# Patient Record
Sex: Female | Born: 1995 | Race: White | Hispanic: No | Marital: Single | Smoking: Former smoker
Health system: Southern US, Community
[De-identification: ages and names within clinical notes are randomized; demographics above are authoritative.]

## PROBLEM LIST (undated history)

## (undated) ENCOUNTER — Inpatient Hospital Stay (HOSPITAL_COMMUNITY): Payer: Self-pay

## (undated) DIAGNOSIS — R87629 Unspecified abnormal cytological findings in specimens from vagina: Secondary | ICD-10-CM

## (undated) DIAGNOSIS — B029 Zoster without complications: Secondary | ICD-10-CM

## (undated) HISTORY — PX: NO PAST SURGERIES: SHX2092

## (undated) HISTORY — DX: Zoster without complications: B02.9

## (undated) HISTORY — DX: Unspecified abnormal cytological findings in specimens from vagina: R87.629

---
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
## 2016-03-23 ENCOUNTER — Ambulatory Visit (INDEPENDENT_AMBULATORY_CARE_PROVIDER_SITE_OTHER): Payer: Self-pay | Admitting: Family Medicine

## 2016-03-23 ENCOUNTER — Encounter: Payer: Self-pay | Admitting: Family Medicine

## 2016-03-23 VITALS — BP 108/69 | HR 89 | Temp 98.4°F | Ht 66.06 in | Wt 133.0 lb

## 2016-03-23 DIAGNOSIS — N926 Irregular menstruation, unspecified: Secondary | ICD-10-CM

## 2016-03-23 DIAGNOSIS — Z3491 Encounter for supervision of normal pregnancy, unspecified, first trimester: Secondary | ICD-10-CM

## 2016-03-23 DIAGNOSIS — N912 Amenorrhea, unspecified: Secondary | ICD-10-CM

## 2016-03-23 LAB — PREGNANCY, URINE: Preg Test, Ur: POSITIVE — AB

## 2016-03-23 NOTE — Patient Instructions (Signed)
Great to meet you guys!  Stop smoking (think about the baby)  Start a daily pre-natal vitamin  Go ahead and start looking into a GYN practice   Estimated delivery date December 15/2017

## 2016-03-23 NOTE — Progress Notes (Signed)
   HPI  Patient presents today here after a missed period  She explains that she is due for her perido today but hasnt had any PMS symptoms that are typical for her.   She has had 2 positive Preg tests today  It is unplanned but welcome, Her boyfriend is present LMP 02/24/2016  They have been having unprotected sex  She is not taking vitamins, she is a smoker.   PMH: Smoking status noted ROS: Per HPI  Objective: BP 108/69 mmHg  Pulse 89  Temp(Src) 98.4 F (36.9 C) (Oral)  Ht 5' 6.06" (1.678 m)  Wt 133 lb (60.328 kg)  BMI 21.43 kg/m2  LMP 02/24/2016 Gen: NAD, alert, cooperative with exam HEENT: NCAT, CV: RRR, good S1/S2, no murmur Resp: CTABL, no wheezes, non-labored Ext: No edema, warm Neuro: Alert and oriented, No gross deficits  Assessment and plan:  #  Missed period, First trimester pregnancy Disccussed pre-natal vitamins and stopping smoking  Discussed prenatal care and encouraged to pursue medicaid right away Call or come back with any questions.   EDD 11/30/2016   Murtis SinkSam Bradshaw, MD [facility] 03/23/2016, 9:13 AM

## 2016-04-26 ENCOUNTER — Inpatient Hospital Stay (HOSPITAL_COMMUNITY)
Admission: AD | Admit: 2016-04-26 | Discharge: 2016-04-26 | Disposition: A | Discharge status: Home / Self Care | Payer: Medicaid Other | Source: Ambulatory Visit | Attending: Obstetrics and Gynecology | Admitting: Obstetrics and Gynecology

## 2016-04-26 ENCOUNTER — Inpatient Hospital Stay (HOSPITAL_COMMUNITY): Payer: Medicaid Other

## 2016-04-26 ENCOUNTER — Encounter (HOSPITAL_COMMUNITY): Payer: Self-pay | Admitting: Student

## 2016-04-26 DIAGNOSIS — O4691 Antepartum hemorrhage, unspecified, first trimester: Secondary | ICD-10-CM | POA: Diagnosis not present

## 2016-04-26 DIAGNOSIS — Z3A08 8 weeks gestation of pregnancy: Secondary | ICD-10-CM | POA: Diagnosis not present

## 2016-04-26 DIAGNOSIS — O209 Hemorrhage in early pregnancy, unspecified: Secondary | ICD-10-CM | POA: Diagnosis not present

## 2016-04-26 DIAGNOSIS — O360110 Maternal care for anti-D [Rh] antibodies, first trimester, not applicable or unspecified: Secondary | ICD-10-CM

## 2016-04-26 DIAGNOSIS — J45909 Unspecified asthma, uncomplicated: Secondary | ICD-10-CM | POA: Diagnosis not present

## 2016-04-26 DIAGNOSIS — O26892 Other specified pregnancy related conditions, second trimester: Secondary | ICD-10-CM

## 2016-04-26 DIAGNOSIS — N898 Other specified noninflammatory disorders of vagina: Secondary | ICD-10-CM

## 2016-04-26 DIAGNOSIS — O3680X0 Pregnancy with inconclusive fetal viability, not applicable or unspecified: Secondary | ICD-10-CM

## 2016-04-26 DIAGNOSIS — N939 Abnormal uterine and vaginal bleeding, unspecified: Secondary | ICD-10-CM | POA: Diagnosis present

## 2016-04-26 LAB — URINALYSIS, ROUTINE W REFLEX MICROSCOPIC
BILIRUBIN URINE: NEGATIVE
GLUCOSE, UA: NEGATIVE mg/dL
KETONES UR: NEGATIVE mg/dL
Leukocytes, UA: NEGATIVE
Nitrite: NEGATIVE
PH: 8.5 — AB (ref 5.0–8.0)
Protein, ur: NEGATIVE mg/dL
SPECIFIC GRAVITY, URINE: 1.015 (ref 1.005–1.030)

## 2016-04-26 LAB — CBC
HEMATOCRIT: 38.4 % (ref 36.0–46.0)
HEMOGLOBIN: 13.2 g/dL (ref 12.0–15.0)
MCH: 30.2 pg (ref 26.0–34.0)
MCHC: 34.4 g/dL (ref 30.0–36.0)
MCV: 87.9 fL (ref 78.0–100.0)
Platelets: 238 10*3/uL (ref 150–400)
RBC: 4.37 MIL/uL (ref 3.87–5.11)
RDW: 12.8 % (ref 11.5–15.5)
WBC: 9.6 10*3/uL (ref 4.0–10.5)

## 2016-04-26 LAB — WET PREP, GENITAL
Sperm: NONE SEEN
TRICH WET PREP: NONE SEEN
YEAST WET PREP: NONE SEEN

## 2016-04-26 LAB — URINE MICROSCOPIC-ADD ON: WBC UA: NONE SEEN WBC/hpf (ref 0–5)

## 2016-04-26 LAB — ABO/RH: ABO/RH(D): O NEG

## 2016-04-26 LAB — HCG, QUANTITATIVE, PREGNANCY: HCG, BETA CHAIN, QUANT, S: 26192 m[IU]/mL — AB (ref <5)

## 2016-04-26 MED ORDER — RHO D IMMUNE GLOBULIN 1500 UNIT/2ML IJ SOSY
300.0000 ug | PREFILLED_SYRINGE | Freq: Once | INTRAMUSCULAR | Status: AC
  Administered 2016-04-26: 300 ug via INTRAMUSCULAR
  Filled 2016-04-26: qty 2

## 2016-04-26 MED ORDER — ACETAMINOPHEN 500 MG PO TABS
1000.0000 mg | ORAL_TABLET | Freq: Once | ORAL | Status: AC
  Administered 2016-04-26: 1000 mg via ORAL
  Filled 2016-04-26: qty 2

## 2016-04-26 NOTE — MAU Note (Signed)
Pt reports vaginal bleeding since friday. Has gotten havier today and bright red. C/o period like cramps.

## 2016-04-26 NOTE — Discharge Instructions (Signed)
Return to care  °· If you have heavier bleeding that soaks through more that 2 pads per hour for an hour or more °· If you bleed so much that you feel like you might pass out or you do pass out °· If you have significant abdominal pain that is not improved with Tylenol  °· If you develop a fever > 100.5 ° ° ° ° °Vaginal Bleeding During Pregnancy, First Trimester °A small amount of bleeding (spotting) from the vagina is relatively common in early pregnancy. It usually stops on its own. Various things may cause bleeding or spotting in early pregnancy. Some bleeding may be related to the pregnancy, and some may not. In most cases, the bleeding is normal and is not a problem. However, bleeding can also be a sign of something serious. Be sure to tell your health care provider about any vaginal bleeding right away. °Some possible causes of vaginal bleeding during the first trimester include: °· Infection or inflammation of the cervix. °· Growths (polyps) on the cervix. °· Miscarriage or threatened miscarriage. °· Pregnancy tissue has developed outside of the uterus and in a fallopian tube (tubal pregnancy). °· Tiny cysts have developed in the uterus instead of pregnancy tissue (molar pregnancy). °HOME CARE INSTRUCTIONS  °Watch your condition for any changes. The following actions may help to lessen any discomfort you are feeling: °· Follow your health care provider's instructions for limiting your activity. If your health care provider orders bed rest, you may need to stay in bed and only get up to use the bathroom. However, your health care provider may allow you to continue light activity. °· If needed, make plans for someone to help with your regular activities and responsibilities while you are on bed rest. °· Keep track of the number of pads you use each day, how often you change pads, and how soaked (saturated) they are. Write this down. °· Do not use tampons. Do not douche. °· Do not have sexual intercourse or  orgasms until approved by your health care provider. °· If you pass any tissue from your vagina, save the tissue so you can show it to your health care provider. °· Only take over-the-counter or prescription medicines as directed by your health care provider. °· Do not take aspirin because it can make you bleed. °· Keep all follow-up appointments as directed by your health care provider. °SEEK MEDICAL CARE IF: °· You have any vaginal bleeding during any part of your pregnancy. °· You have cramps or labor pains. °· You have a fever, not controlled by medicine. °SEEK IMMEDIATE MEDICAL CARE IF:  °· You have severe cramps in your back or belly (abdomen). °· You pass large clots or tissue from your vagina. °· Your bleeding increases. °· You feel light-headed or weak, or you have fainting episodes. °· You have chills. °· You are leaking fluid or have a gush of fluid from your vagina. °· You pass out while having a bowel movement. °MAKE SURE YOU: °5. Understand these instructions. °6. Will watch your condition. °7. Will get help right away if you are not doing well or get worse. °  °This information is not intended to replace advice given to you by your health care provider. Make sure you discuss any questions you have with your health care provider. °  °Document Released: 09/12/2005 Document Revised: 12/08/2013 Document Reviewed: 08/10/2013 °Elsevier Interactive Patient Education ©2016 Elsevier Inc. ° °

## 2016-04-26 NOTE — MAU Provider Note (Signed)
History     CSN: [id number]  Arrival date and time: 04/26/16 1258    First Provider Initiated Contact with Patient 04/26/16 1335        Chief Complaint  Patient presents with  . Vaginal Bleeding   HPI  Tonya Parks is a 20 y.o. G1P0 at [redacted]w[redacted]d by LMP who presents with abdominal cramping & vaginal bleeding. Patient is going to an ob/gyn in [place] but has not had an ultrasound during this pregnancy. Went to ob this morning & was told bleeding was normal but did not have exam or ultrasound done today.  Symptoms began Friday. Started has red spotting but has increased over the last few days. Reports moderate amount of dark red blood that is filling pads and several small clots. Lower abdominal cramping is constant. Rates 10/10. Took 1 ES tylenol yesterday with no relief.  Denies n/v/d, constipation, dysuria, fever.  Last had intercourse last night.   OB History    Gravida Para Term Preterm AB TAB SAB Ectopic Multiple Living   1               Past Medical History  Diagnosis Date  . Asthma   . Migraine     Past Surgical History  Procedure Laterality Date  . No past surgeries      Family History  Problem Relation Age of Onset  . Stroke Mother   . COPD Mother   . Asthma Mother     Social History  Substance Use Topics  . Smoking status: Never Smoker   . Smokeless tobacco: Not on file  . Alcohol Use: No    Allergies: No Known Allergies  No prescriptions prior to admission    Review of Systems  Constitutional: Negative.   Gastrointestinal: Positive for abdominal pain. Negative for nausea, vomiting, diarrhea and constipation.  Genitourinary: Negative for dysuria.       + vaginal bleeding   Physical Exam   Blood pressure 121/70, pulse 98, temperature 97.8 F (36.6 C), temperature source Oral, resp. rate 18, last menstrual period 02/24/2016.  Physical Exam  Nursing note and vitals reviewed. Constitutional: She is oriented to person, place, and time. She appears  well-developed and well-nourished. No distress.  HENT:  Head: Normocephalic and atraumatic.  Eyes: Conjunctivae are normal. Right eye exhibits no discharge. Left eye exhibits no discharge. No scleral icterus.  Neck: Normal range of motion.  Cardiovascular: Normal rate, regular rhythm and normal heart sounds.   No murmur heard. Respiratory: Effort normal and breath sounds normal. No respiratory distress. She has no wheezes.  GI: Soft. Bowel sounds are normal. There is tenderness in the right lower quadrant, suprapubic area and left lower quadrant. There is no rebound, no guarding and no tenderness at McBurney's point.  Genitourinary: Uterus is enlarged. Uterus is not tender. Cervix exhibits no motion tenderness. There is bleeding (moderate amount of dark red blood) in the vagina.  Cervix closed  Neurological: She is alert and oriented to person, place, and time.  Skin: Skin is warm and dry. She is not diaphoretic.  Psychiatric: She has a normal mood and affect. Her behavior is normal. Judgment and thought content normal.    MAU Course  Procedures Results for orders placed or performed during the hospital encounter of 04/26/16 (from the past 24 hour(s))  CBC     Status: None   Collection Time: 04/26/16  1:19 PM  Result Value Ref Range   WBC 9.6 4.0 - 10.5 K/uL   RBC  4.37 3.87 - 5.11 MIL/uL   Hemoglobin 13.2 12.0 - 15.0 g/dL   HCT 38.4 09.6 - 04.5 %   MCV 87.9 16.18.0 - 100.0 fL   MCH 30.2 26.0 - 34.0 pg   MCHC 34.4 30.0 - 36.0 g/dL   RDW 40.9 81.1 - 91.4 %   Platelets 238 150 - 400 K/uL  ABO/Rh     Status: None (Preliminary result)   Collection Time: 04/26/16  1:19 PM  Result Value Ref Range   ABO/RH(D) O NEG   hCG, quantitative, pregnancy     Status: Abnormal   Collection Time: 04/26/16  1:19 PM  Result Value Ref Range   hCG, Beta Chain, Quant, S 78295 (H) <5 mIU/mL  Wet prep, genital     Status: Abnormal   Collection Time: 04/26/16  1:40 PM  Result Value Ref Range   Yeast Wet  Prep HPF POC NONE SEEN NONE SEEN   Trich, Wet Prep NONE SEEN NONE SEEN   Clue Cells Wet Prep HPF POC FEW (A) NONE SEEN   WBC, Wet Prep HPF POC FEW (A) NONE SEEN   Sperm NONE SEEN   Urinalysis, Routine w reflex microscopic (not at [facility])     Status: Abnormal   Collection Time: 04/26/16  2:32 PM  Result Value Ref Range   Color, Urine YELLOW YELLOW   APPearance HAZY (A) CLEAR   Specific Gravity, Urine 1.015 1.005 - 1.030   pH 8.5 (H) 5.0 - 8.0   Glucose, UA NEGATIVE NEGATIVE mg/dL   Hgb urine dipstick LARGE (A) NEGATIVE   Bilirubin Urine NEGATIVE NEGATIVE   Ketones, ur NEGATIVE NEGATIVE mg/dL   Protein, ur NEGATIVE NEGATIVE mg/dL   Nitrite NEGATIVE NEGATIVE   Leukocytes, UA NEGATIVE NEGATIVE  Urine microscopic-add on     Status: Abnormal   Collection Time: 04/26/16  2:32 PM  Result Value Ref Range   Squamous Epithelial / LPF 0-5 (A) NONE SEEN   WBC, UA NONE SEEN 0 - 5 WBC/hpf   RBC / HPF TOO NUMEROUS TO COUNT 0 - 5 RBC/hpf   Bacteria, UA MANY (A) NONE SEEN   Urine-Other AMORPHOUS URATES/PHOSPHATES    US Ob Comp Less 14 Wks  04/26/2016  CLINICAL DATA:  Pregnant patient with vaginal bleeding. EXAM: OBSTETRIC <14 WK [place] AND TRANSVAGINAL OB US TECHNIQUE: Both transabdominal and transvaginal ultrasound examinations were performed for complete evaluation of the gestation as well as the maternal uterus, adnexal regions, and pelvic cul-de-sac. Transvaginal technique was performed to assess early pregnancy. COMPARISON:  None. FINDINGS: Intrauterine gestational sac: None Yolk sac:  Not present Embryo:  Not present Cardiac Activity: Not present Maternal uterus/adnexae: The endometrium measures 14 mm. Normal right and left ovaries. Trace free fluid in the pelvis. No adnexal masses are identified. IMPRESSION: No intrauterine gestation identified. In the setting of positive pregnancy test and no definite intrauterine pregnancy, this reflects a pregnancy of unknown location. Differential considerations  include early normal IUP, abnormal IUP, or nonvisualized ectopic pregnancy. Differentiation is achieved with serial beta HCG supplemented by repeat sonography as clinically warranted. Electronically Signed   By: Annia Belt M.D.   On: 04/26/2016 14:23   US Ob Transvaginal  04/26/2016  CLINICAL DATA:  Pregnant patient with vaginal bleeding. EXAM: OBSTETRIC <14 WK [place] AND TRANSVAGINAL OB US TECHNIQUE: Both transabdominal and transvaginal ultrasound examinations were performed for complete evaluation of the gestation as well as the maternal uterus, adnexal regions, and pelvic cul-de-sac. Transvaginal technique was performed to assess early pregnancy.  COMPARISON:  None. FINDINGS: Intrauterine gestational sac: None Yolk sac:  Not present Embryo:  Not present Cardiac Activity: Not present Maternal uterus/adnexae: The endometrium measures 14 mm. Normal right and left ovaries. Trace free fluid in the pelvis. No adnexal masses are identified. IMPRESSION: No intrauterine gestation identified. In the setting of positive pregnancy test and no definite intrauterine pregnancy, this reflects a pregnancy of unknown location. Differential considerations include early normal IUP, abnormal IUP, or nonvisualized ectopic pregnancy. Differentiation is achieved with serial beta HCG supplemented by repeat sonography as clinically warranted. Electronically Signed   By: Annia Beltrew  Davis M.D.   On: 04/26/2016 14:23    MDM O negative - will give rhophylac today VSS, hemoglobin 13.2 Ultrasound shows no IUP or adnexal mass with a BHCG of 26,192. Possible that patient recently passed the gestation but as we don't have any documentation that patient had an IUP with this pregnancy, ectopic cannot be ruled out -- will need her to follow up for a BHCG in 48 hours.  S/w Dr. Jolayne Pantheronstant regarding labs & ultrasound - [facility] results with patient. Offered chaplain services - she declines at this time.    Assessment and Plan  A: 1. Pregnancy of unknown anatomic location   2. Rh negative state in antepartum period, first trimester, not applicable or unspecified fetus   3. Vaginal bleeding in pregnancy, first trimester    P: Discharge home Return to MAU on Saturday for repeat BHCG Discussed return precautions Pelvic rest Comfort care pack given  Judeth Hornrin Ciena Sampley 04/26/2016, 1:32 PM

## 2016-04-27 LAB — RH IG WORKUP (INCLUDES ABO/RH)
ABO/RH(D): O NEG
Antibody Screen: NEGATIVE
Gestational Age(Wks): 8
UNIT DIVISION: 0

## 2016-04-27 LAB — GC/CHLAMYDIA PROBE AMP (~~LOC~~) NOT AT ARMC
Chlamydia: NEGATIVE
Neisseria Gonorrhea: NEGATIVE

## 2016-04-27 LAB — HIV ANTIBODY (ROUTINE TESTING W REFLEX): HIV SCREEN 4TH GENERATION: NONREACTIVE

## 2016-04-28 ENCOUNTER — Encounter (HOSPITAL_COMMUNITY): Payer: Self-pay

## 2016-04-28 ENCOUNTER — Inpatient Hospital Stay (HOSPITAL_COMMUNITY)
Admission: AD | Admit: 2016-04-28 | Discharge: 2016-04-28 | Disposition: A | Discharge status: Home / Self Care | Payer: Medicaid Other | Source: Ambulatory Visit | Attending: Obstetrics & Gynecology | Admitting: Obstetrics & Gynecology

## 2016-04-28 DIAGNOSIS — O039 Complete or unspecified spontaneous abortion without complication: Secondary | ICD-10-CM | POA: Insufficient documentation

## 2016-04-28 LAB — HCG, QUANTITATIVE, PREGNANCY: HCG, BETA CHAIN, QUANT, S: 4183 m[IU]/mL — AB (ref <5)

## 2016-04-28 NOTE — MAU Provider Note (Signed)
MAU HISTORY AND PHYSICAL  Chief Complaint:  Follow-up   Tonya Parks is a 20 y.o.  G1P0 with IUP at [redacted]w[redacted]d presenting for Follow-up  One week ago began spotting, then bleeding like a period, and on 5/11 with strong cramping. Presented here where hcg 26000, no iup seen on [place], no signs ectopic, either. Since discharge pain has stopped and bleeding is minimal. No fevers or n/v.   Past Medical History  Diagnosis Date  . Asthma   . Migraine     Past Surgical History  Procedure Laterality Date  . No past surgeries      Family History  Problem Relation Age of Onset  . Stroke Mother   . COPD Mother   . Asthma Mother     Social History  Substance Use Topics  . Smoking status: Current Every Day Smoker -- 1.00 packs/day    Types: Cigarettes  . Smokeless tobacco: None  . Alcohol Use: No    No Known Allergies  No prescriptions prior to admission    Review of Systems - Negative except for what is mentioned in HPI.  Physical Exam  Blood pressure 109/62, pulse 78, temperature 98.2 F (36.8 C), temperature source Oral, resp. rate 18, height  (1.676 m), weight 141 lb 12.8 oz (64.32 kg), last menstrual period 02/24/2016. GENERAL: Well-developed, well-nourished female in no acute distress.  LUNGS: Clear to auscultation bilaterally.  HEART: Regular rate and rhythm. ABDOMEN: Soft, nontender, nondistended.  EXTREMITIES: Nontender, no edema, 2+ distal pulses. Cervical Exam: deferred   Labs: Results for orders placed or performed during the hospital encounter of 04/28/16 (from the past 24 hour(s))  hCG, quantitative, pregnancy   Collection Time: 04/28/16 10:06 AM  Result Value Ref Range   hCG, Beta Chain, Quant, S 4183 (H) <5 mIU/mL    Imaging Studies:  US Ob Comp Less 14 Wks  04/26/2016  CLINICAL DATA:  Pregnant patient with vaginal bleeding. EXAM: OBSTETRIC <14 WK [place] AND TRANSVAGINAL OB US TECHNIQUE: Both transabdominal and transvaginal ultrasound examinations were  performed for complete evaluation of the gestation as well as the maternal uterus, adnexal regions, and pelvic cul-de-sac. Transvaginal technique was performed to assess early pregnancy. COMPARISON:  None. FINDINGS: Intrauterine gestational sac: None Yolk sac:  Not present Embryo:  Not present Cardiac Activity: Not present Maternal uterus/adnexae: The endometrium measures 14 mm. Normal right and left ovaries. Trace free fluid in the pelvis. No adnexal masses are identified. IMPRESSION: No intrauterine gestation identified. In the setting of positive pregnancy test and no definite intrauterine pregnancy, this reflects a pregnancy of unknown location. Differential considerations include early normal IUP, abnormal IUP, or nonvisualized ectopic pregnancy. Differentiation is achieved with serial beta HCG supplemented by repeat sonography as clinically warranted. Electronically Signed   By: Annia Belt M.D.   On: 04/26/2016 14:23   US Ob Transvaginal  04/26/2016  CLINICAL DATA:  Pregnant patient with vaginal bleeding. EXAM: OBSTETRIC <14 WK [place] AND TRANSVAGINAL OB US TECHNIQUE: Both transabdominal and transvaginal ultrasound examinations were performed for complete evaluation of the gestation as well as the maternal uterus, adnexal regions, and pelvic cul-de-sac. Transvaginal technique was performed to assess early pregnancy. COMPARISON:  None. FINDINGS: Intrauterine gestational sac: None Yolk sac:  Not present Embryo:  Not present Cardiac Activity: Not present Maternal uterus/adnexae: The endometrium measures 14 mm. Normal right and left ovaries. Trace free fluid in the pelvis. No adnexal masses are identified. IMPRESSION: No intrauterine gestation identified. In the setting of positive pregnancy test and  no definite intrauterine pregnancy, this reflects a pregnancy of unknown location. Differential considerations include early normal IUP, abnormal IUP, or nonvisualized ectopic pregnancy. Differentiation is achieved  with serial beta HCG supplemented by repeat sonography as clinically warranted. Electronically Signed   By: Annia Beltrew  Davis M.D.   On: 04/26/2016 14:23    Assessment: Tonya Parks is  20 y.o. G1P0 at 242w1d presents for f/u SAB. Appears to be resolving SAB. HCG significantly lower today than 2 days prior, and currently no pain and minimal bleeding.  Plan: - bleeding/infection/ectopic return precautions - weekly HCG in clinic to trend to zero - pelvic rest until HCG zero  Tonya RiedelNoah B Dravyn Parks 5/13/20171:53 PM

## 2016-04-28 NOTE — MAU Note (Signed)
Patient reports decrease in bleeding only having spotting, denies pain at this time, here for follow up BHCG

## 2016-04-28 NOTE — Discharge Instructions (Signed)
Miscarriage  A miscarriage is the sudden loss of an unborn baby (fetus) before the 20th week of pregnancy. Most miscarriages happen in the first 3 months of pregnancy. Sometimes, it happens before a woman even knows she is pregnant. A miscarriage is also called a "spontaneous miscarriage" or "early pregnancy loss." Having a miscarriage can be an emotional experience. Talk with your caregiver about any questions you may have about miscarrying, the grieving process, and your future pregnancy plans.  CAUSES    Problems with the fetal chromosomes that make it impossible for the baby to develop normally. Problems with the baby's genes or chromosomes are most often the result of errors that occur, by chance, as the embryo divides and grows. The problems are not inherited from the parents.   Infection of the cervix or uterus.    Hormone problems.    Problems with the cervix, such as having an incompetent cervix. This is when the tissue in the cervix is not strong enough to hold the pregnancy.    Problems with the uterus, such as an abnormally shaped uterus, uterine fibroids, or congenital abnormalities.    Certain medical conditions.    Smoking, drinking alcohol, or taking illegal drugs.    Trauma.   Often, the cause of a miscarriage is unknown.   SYMPTOMS    Vaginal bleeding or spotting, with or without cramps or pain.   Pain or cramping in the abdomen or lower back.   Passing fluid, tissue, or blood clots from the vagina.  DIAGNOSIS   Your caregiver will perform a physical exam. You may also have an ultrasound to confirm the miscarriage. Blood or urine tests may also be ordered.  TREATMENT    Sometimes, treatment is not necessary if you naturally pass all the fetal tissue that was in the uterus. If some of the fetus or placenta remains in the body (incomplete miscarriage), tissue left behind may become infected and must be removed. Usually, a dilation and curettage (D and C) procedure is performed.  During a D and C procedure, the cervix is widened (dilated) and any remaining fetal or placental tissue is gently removed from the uterus.   Antibiotic medicines are prescribed if there is an infection. Other medicines may be given to reduce the size of the uterus (contract) if there is a lot of bleeding.   If you have Rh negative blood and your baby was Rh positive, you will need a Rh immunoglobulin shot. This shot will protect any future baby from having Rh blood problems in future pregnancies.  HOME CARE INSTRUCTIONS    Your caregiver may order bed rest or may allow you to continue light activity. Resume activity as directed by your caregiver.   Have someone help with home and family responsibilities during this time.    Keep track of the number of sanitary pads you use each day and how soaked (saturated) they are. Write down this information.    Do not use tampons. Do not douche or have sexual intercourse until approved by your caregiver.    Only take over-the-counter or prescription medicines for pain or discomfort as directed by your caregiver.    Do not take aspirin. Aspirin can cause bleeding.    Keep all follow-up appointments with your caregiver.    If you or your partner have problems with grieving, talk to your caregiver or seek counseling to help cope with the pregnancy loss. Allow enough time to grieve before trying to get pregnant again.     SEEK IMMEDIATE MEDICAL CARE IF:    You have severe cramps or pain in your back or abdomen.   You have a fever.   You pass large blood clots (walnut-sized or larger) ortissue from your vagina. Save any tissue for your caregiver to inspect.    Your bleeding increases.    You have a thick, bad-smelling vaginal discharge.   You become lightheaded, weak, or you faint.    You have chills.   MAKE SURE YOU:   Understand these instructions.   Will watch your condition.   Will get help right away if you are not doing well or get worse.     This  information is not intended to replace advice given to you by your health care provider. Make sure you discuss any questions you have with your health care provider.     Document Released: 05/29/2001 Document Revised: 03/30/2013 Document Reviewed: 01/22/2012  Elsevier Interactive Patient Education 2016 [employer]vier Inc[employer]

## 2016-05-04 ENCOUNTER — Other Ambulatory Visit: Payer: Medicaid Other

## 2016-05-07 ENCOUNTER — Other Ambulatory Visit: Payer: Medicaid Other

## 2016-05-07 DIAGNOSIS — O3680X0 Pregnancy with inconclusive fetal viability, not applicable or unspecified: Secondary | ICD-10-CM

## 2016-05-08 LAB — HCG, QUANTITATIVE, PREGNANCY: HCG, BETA CHAIN, QUANT, S: 76 m[IU]/mL — AB

## 2016-05-09 ENCOUNTER — Telehealth: Payer: Self-pay | Admitting: *Deleted

## 2016-05-09 NOTE — Telephone Encounter (Signed)
-----   Message from Adam PhenixJames G Arnold, MD sent at 05/09/2016 10:42 AM EDT ----- Decreased HCG c/w miscarriage, f/u appt here or with [facility]

## 2016-05-09 NOTE — Telephone Encounter (Signed)
Called patient and notified of test results being consistent with miscarriage. Patient wanted to let us know that even though she was told not to have sex she did the night before her blood draw because her bleeding had stopped and she thought it would be ok. Wanted to know it this would have affected anything. I reassured her that at that point the miscarriage had probably already been completed and the results would not have been changed. Gave patient the option of f/u in this clinic or with Dr Ermalinda Memosbradshaw. She prefers to come to this clinic. Will have front office call her to schedule a f/u visit.

## 2016-05-18 ENCOUNTER — Other Ambulatory Visit: Payer: Medicaid Other

## 2016-05-18 DIAGNOSIS — O039 Complete or unspecified spontaneous abortion without complication: Secondary | ICD-10-CM

## 2016-05-18 LAB — HCG, QUANTITATIVE, PREGNANCY: HCG, BETA CHAIN, QUANT, S: 4.9 m[IU]/mL

## 2016-05-21 ENCOUNTER — Telehealth: Payer: Self-pay | Admitting: *Deleted

## 2016-05-21 NOTE — Telephone Encounter (Signed)
Per message from Dr. Ashok PallWouk need to call patient and tell  Her bhcg is essentially zero- can stop drawing bhcg- can stop pelvic rest. Miscarriage is complete.   Called Tonya Parks and left a message we are calling with some information- please call clinic.

## 2016-05-22 ENCOUNTER — Encounter: Payer: Self-pay | Admitting: *Deleted

## 2016-05-22 NOTE — Telephone Encounter (Signed)
Called Tonya FortKarlie and left a message we are trying to reach you with some non-urgent information. Also called her contact number and left a message we are trying to reach Tonya Parks- please ask her to call clinic.  Will send letter.

## 2017-03-01 ENCOUNTER — Encounter (HOSPITAL_COMMUNITY): Payer: Self-pay

## 2018-02-21 IMAGING — US US OB COMP LESS 14 WK
1 series · 15 of 28 positions shown · non-contrast
Comparison: None.

CLINICAL DATA: Pregnant patient with vaginal bleeding.

EXAM:
OBSTETRIC <14 WK US AND TRANSVAGINAL OB US
TECHNIQUE: Both transabdominal and transvaginal ultrasound examinations were
performed for complete evaluation of the gestation as well as the
maternal uterus, adnexal regions, and pelvic cul-de-sac.
Transvaginal technique was performed to assess early pregnancy.

[Series 1: us ob comp less 14 wk · 15 of 38 slices shown]
[im 1/38]
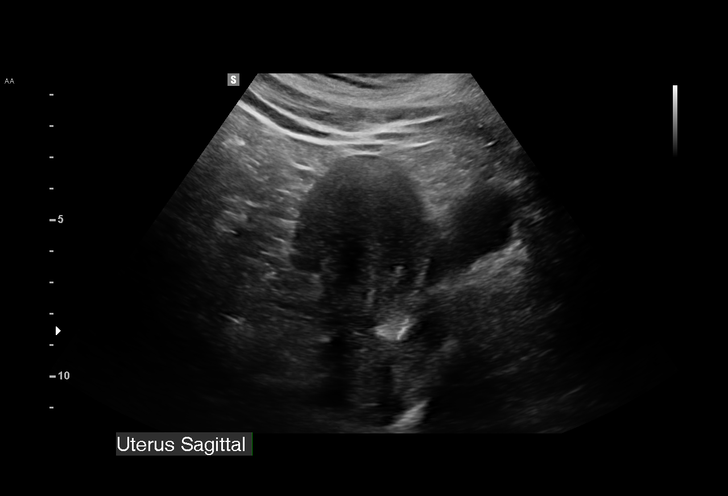
[im 3/38]
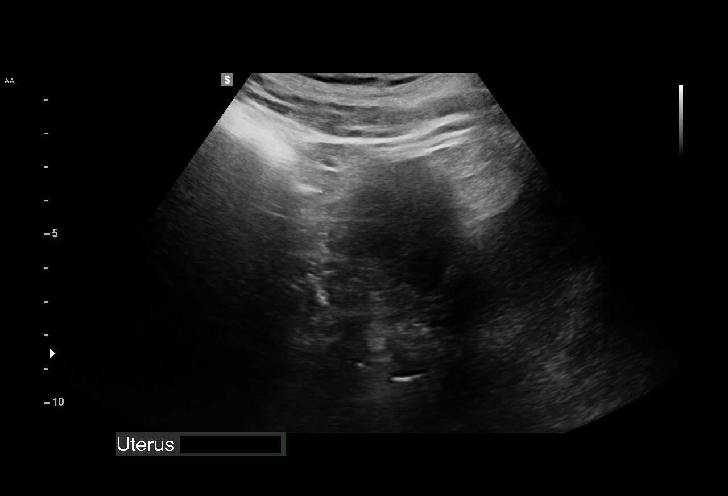
[im 6/38]
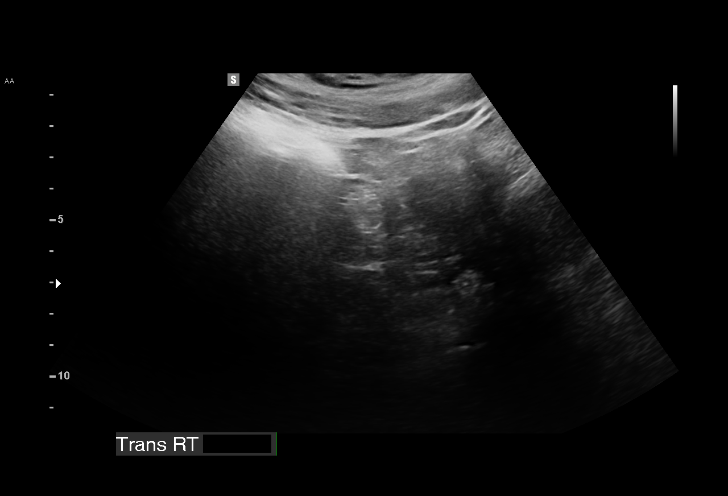
[im 9/38]
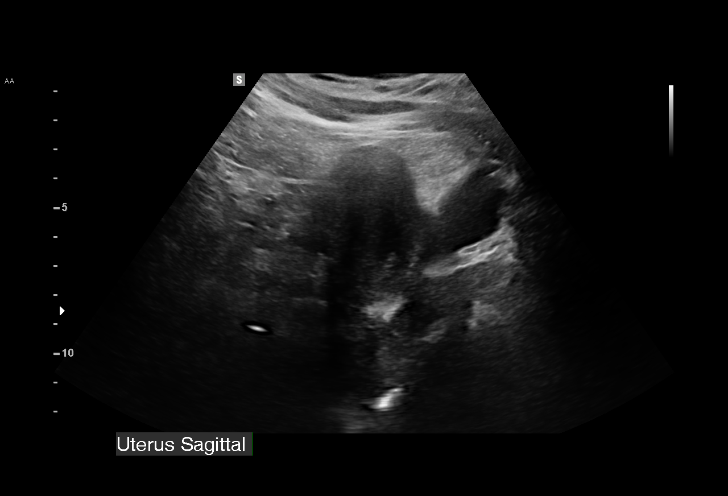
[im 11/38]
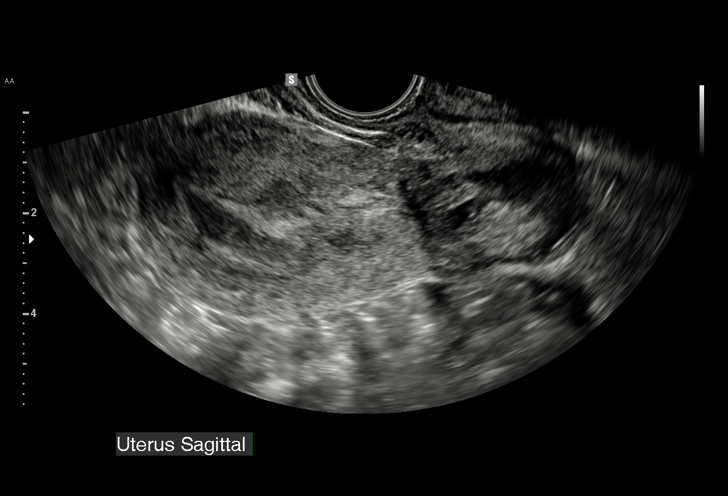
[im 14/38]
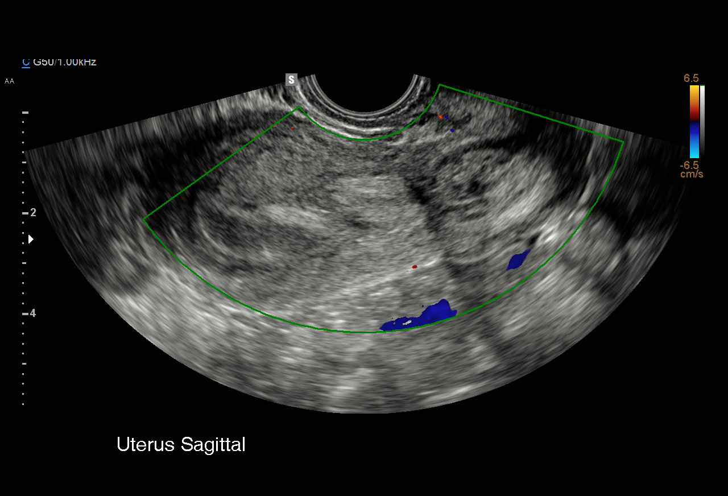
[im 17/38]
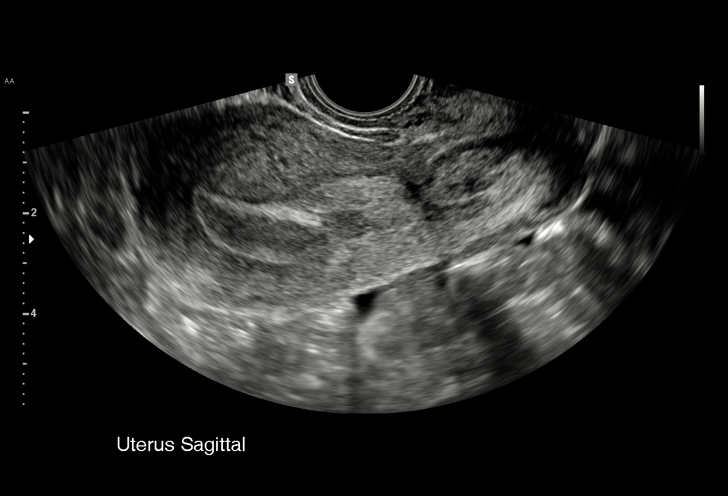
[im 20/38]
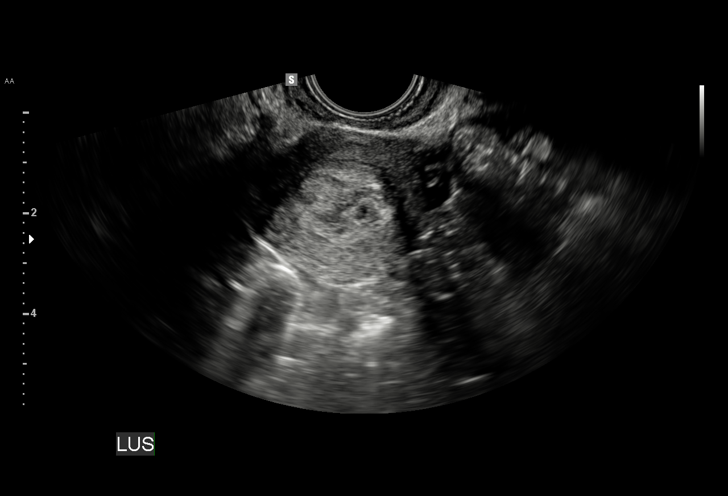
[im 21/38]
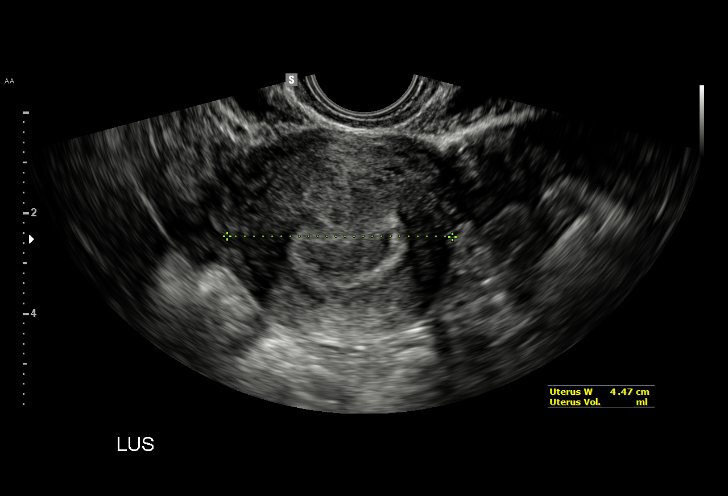
[im 24/38]
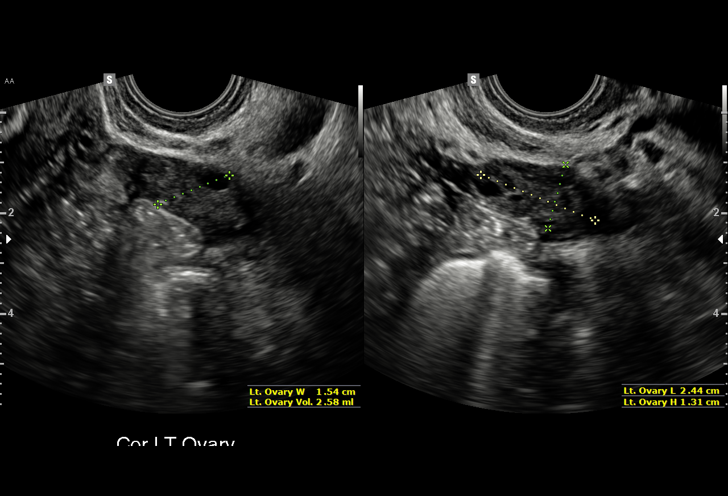
[im 27/38]
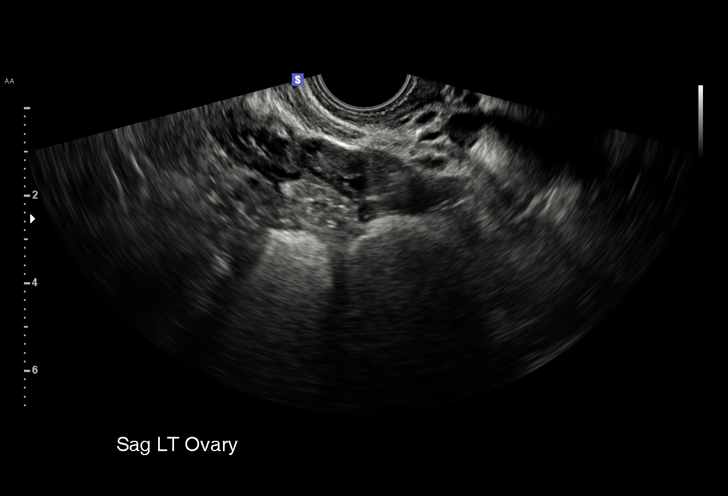
[im 29/38]
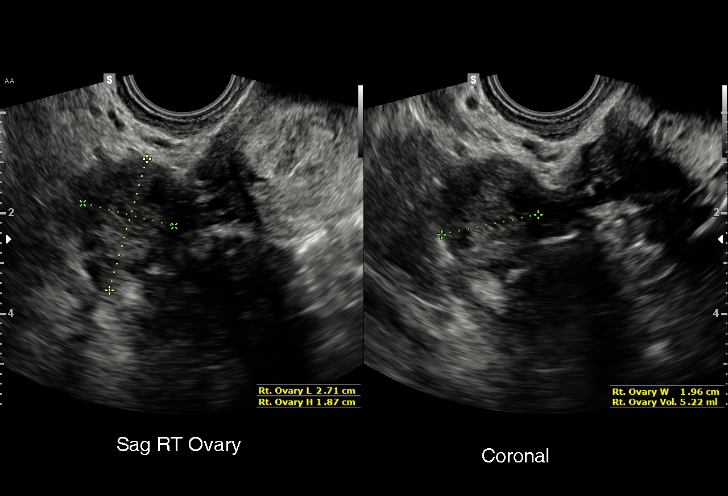
[im 32/38]
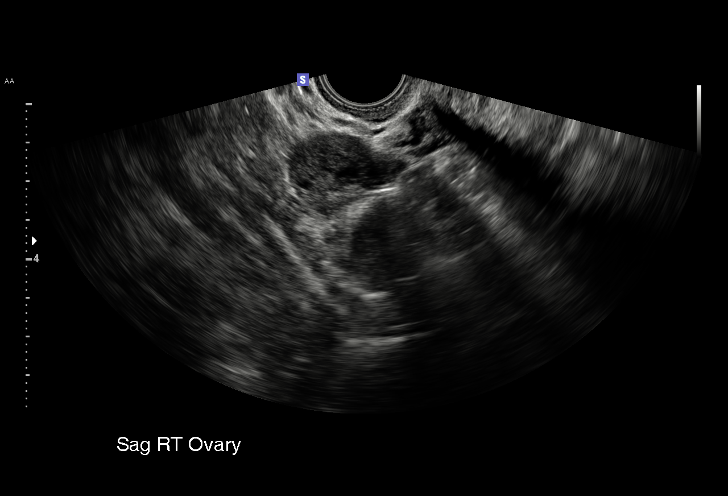
[im 35/38]
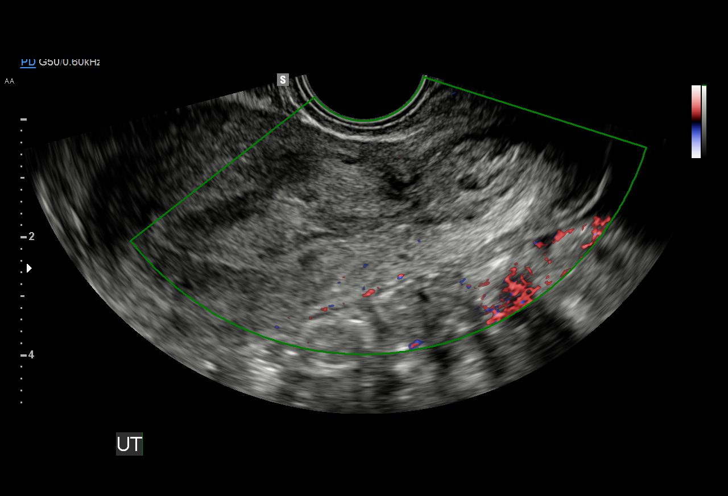
[im 38/38]
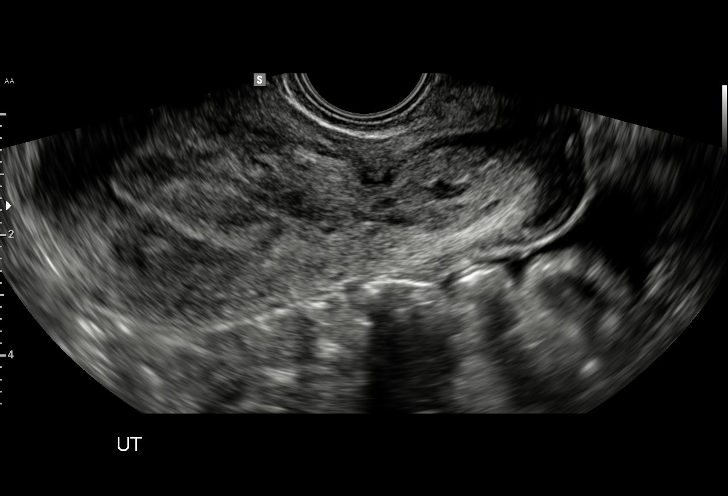

[15 of 28 positions shown; findings below may reference images not displayed]

FINDINGS: Intrauterine gestational sac: None

Yolk sac:  Not present

Embryo:  Not present

Cardiac Activity: Not present

Maternal uterus/adnexae: The endometrium measures 14 mm. Normal
right and left ovaries. Trace free fluid in the pelvis. No adnexal
masses are identified.
IMPRESSION: No intrauterine gestation identified. In the setting of positive
pregnancy test and no definite intrauterine pregnancy, this reflects
a pregnancy of unknown location. Differential considerations include
early normal IUP, abnormal IUP, or nonvisualized ectopic pregnancy.
Differentiation is achieved with serial beta HCG supplemented by
repeat sonography as clinically warranted.

## 2020-05-21 ENCOUNTER — Other Ambulatory Visit: Payer: Self-pay

## 2020-05-21 ENCOUNTER — Ambulatory Visit (HOSPITAL_COMMUNITY)
Admission: EM | Admit: 2020-05-21 | Discharge: 2020-05-21 | Disposition: A | Discharge status: Home / Self Care | Payer: Self-pay | Attending: Family Medicine | Admitting: Family Medicine

## 2020-05-21 ENCOUNTER — Encounter (HOSPITAL_COMMUNITY): Payer: Self-pay

## 2020-05-21 DIAGNOSIS — R059 Cough, unspecified: Secondary | ICD-10-CM

## 2020-05-21 DIAGNOSIS — F17210 Nicotine dependence, cigarettes, uncomplicated: Secondary | ICD-10-CM | POA: Insufficient documentation

## 2020-05-21 DIAGNOSIS — Z825 Family history of asthma and other chronic lower respiratory diseases: Secondary | ICD-10-CM | POA: Insufficient documentation

## 2020-05-21 DIAGNOSIS — R0981 Nasal congestion: Secondary | ICD-10-CM | POA: Insufficient documentation

## 2020-05-21 DIAGNOSIS — J029 Acute pharyngitis, unspecified: Secondary | ICD-10-CM | POA: Insufficient documentation

## 2020-05-21 DIAGNOSIS — Z20822 Contact with and (suspected) exposure to covid-19: Secondary | ICD-10-CM | POA: Insufficient documentation

## 2020-05-21 DIAGNOSIS — R05 Cough: Secondary | ICD-10-CM | POA: Insufficient documentation

## 2020-05-21 DIAGNOSIS — Z823 Family history of stroke: Secondary | ICD-10-CM | POA: Insufficient documentation

## 2020-05-21 LAB — SARS CORONAVIRUS 2 (TAT 6-24 HRS): SARS Coronavirus 2: NEGATIVE

## 2020-05-21 MED ORDER — PREDNISONE 10 MG (21) PO TBPK
ORAL_TABLET | Freq: Every day | ORAL | 0 refills | Status: DC
Start: 2020-05-21 — End: 2021-08-15

## 2020-05-21 NOTE — ED Triage Notes (Signed)
Pt c/o productive cough w/brwon, yellow/green mucous, nasal congestionx1 mos. Pt has non labored breathing. Lungs clear.

## 2020-05-21 NOTE — Discharge Instructions (Signed)
You have been tested for COVID-19 today. °If your test returns positive, you will receive a phone call from [place] regarding your results. °Negative test results are not called. °Both positive and negative results area always visible on MyChart. °If you do not have a MyChart account, sign up instructions are provided in your discharge papers. °Please do not hesitate to contact us should you have questions or concerns. ° °

## 2020-05-23 NOTE — ED Provider Notes (Signed)
[facility]   [id number] 05/21/20 Arrival Time: 1402  ASSESSMENT & PLAN:  1. Cough   2. Nasal congestion   3. Sore throat      COVID-19 testing sent. See letter/work note on file for self-isolation guidelines. OTC symptom care as needed.  Follow-up Information    [facility].   Specialty: Urgent Care Why: As needed. Contact information: [address] [place] [address] [phone]          Reviewed expectations re: course of current medical issues. Questions answered. Outlined signs and symptoms indicating need for more acute intervention. Understanding verbalized. After Visit Summary given.   SUBJECTIVE: History from: patient. Tonya Parks is a 24 y.o. female who reports cough, nasal congestion, sore throat. Gradual onset over the past few weeks; questions allergies; slightly worse lately. Known COVID-19 contact: none. Recent travel: none. Denies: SOB. Normal PO intake without n/v/d.    OBJECTIVE:  Vitals:   05/21/20 1450 05/21/20 1451  BP: 111/64   Pulse: 94   Resp: 16   Temp: 98.5 F (36.9 C)   TempSrc: Oral   SpO2: 97%   Weight:  88 kg  Height:  5\' 8"  (1.727 m)    General appearance: alert; no distress Eyes: PERRLA; EOMI; conjunctiva normal HENT: [place]; AT; nasal congestion Neck: supple  Lungs: speaks full sentences without difficulty; unlabored Extremities: no edema Skin: warm and dry Neurologic: normal gait Psychological: alert and cooperative; normal mood and affect  Labs:  Labs Reviewed  SARS CORONAVIRUS 2 (TAT 6-24 HRS)    Imaging: No results found.  No Known Allergies  Past Medical History:  Diagnosis Date  . Asthma   . Migraine    Social History   Socioeconomic History  . Marital status: Single    Spouse name: Not on file  . Number of children: Not on file  . Years of education: Not on file  . Highest education level: Not on file  Occupational History  .  Not on file  Tobacco Use  . Smoking status: Current Every Day Smoker    Packs/day: 1.00    Types: Cigarettes  Substance and Sexual Activity  . Alcohol use: No  . Drug use: No  . Sexual activity: Yes    Birth control/protection: None    Comment: last intercourse 04/25/16  Other Topics Concern  . Not on file  Social History Narrative  . Not on file   Social Determinants of Health   Financial Resource Strain:   . Difficulty of Paying Living Expenses:   Food Insecurity:   . Worried About 06/25/16 in the Last Year:   . Programme researcher, broadcasting/film/video in the Last Year:   Transportation Needs:   . Barista (Medical):   Freight forwarder Lack of Transportation (Non-Medical):   Physical Activity:   . Days of Exercise per Week:   . Minutes of Exercise per Session:   Stress:   . Feeling of Stress :   Social Connections:   . Frequency of Communication with Friends and Family:   . Frequency of Social Gatherings with Friends and Family:   . Attends Religious Services:   . Active Member of Clubs or Organizations:   . Attends [employer] Meetings:   Banker Marital Status:   Intimate Partner Violence:   . Fear of Current or Ex-Partner:   . Emotionally Abused:   [employer] Physically Abused:   . Sexually Abused:  Family History  Problem Relation Age of Onset  . Stroke Mother   . COPD Mother   . Asthma Mother    Past Surgical History:  Procedure Laterality Date  . NO PAST SURGERIES       Vanessa Kick, MD 05/23/20 [phone]

## 2021-06-22 ENCOUNTER — Encounter: Payer: Medicaid Other | Admitting: Adult Health

## 2021-08-15 ENCOUNTER — Ambulatory Visit (INDEPENDENT_AMBULATORY_CARE_PROVIDER_SITE_OTHER): Payer: Self-pay | Admitting: Adult Health

## 2021-08-15 ENCOUNTER — Other Ambulatory Visit: Payer: Self-pay

## 2021-08-15 ENCOUNTER — Encounter: Payer: Self-pay | Admitting: Adult Health

## 2021-08-15 VITALS — BP 115/85 | HR 81 | Ht 65.0 in | Wt 188.0 lb

## 2021-08-15 DIAGNOSIS — Z3202 Encounter for pregnancy test, result negative: Secondary | ICD-10-CM | POA: Insufficient documentation

## 2021-08-15 DIAGNOSIS — Z8742 Personal history of other diseases of the female genital tract: Secondary | ICD-10-CM

## 2021-08-15 DIAGNOSIS — Z7689 Persons encountering health services in other specified circumstances: Secondary | ICD-10-CM | POA: Insufficient documentation

## 2021-08-15 LAB — POCT URINE PREGNANCY: Preg Test, Ur: NEGATIVE

## 2021-08-15 NOTE — Progress Notes (Signed)
  Subjective:     Patient ID: Tonya Parks, female   DOB: Jul 09, 1996, 25 y.o.   MRN: [id number]  HPI Tonya Parks is a 25 year old white female,single, G1P1 in to establish GYN care and discuss abnormal pap from 03/20/21 in [place]. She says that she did not get good explanation of pap and she is worried.  She had last depo in about April and is using condoms most of the time. She has taken Plan B, recently.  Review of Systems Patient denies any headaches, hearing loss, fatigue, blurred vision, shortness of breath, chest pain, abdominal pain, problems with bowel movements, urination, or intercourse. No joint pain or mood swings. Reviewed past medical,surgical, social and family history. Reviewed medications and allergies.     Objective:   Physical Exam BP 115/85 (BP Location: Left Arm, Patient Position: Sitting, Cuff Size: Normal)   Pulse 81   Ht 5\' 5"  (1.651 m)   Wt 188 lb (85.3 kg)   LMP 08/01/2021 (Approximate)   Breastfeeding No   BMI 31.28 kg/m     UPT is negative.Skin warm and dry. Neck: mid line trachea, normal thyroid, good ROM, no lymphadenopathy noted. Lungs: clear to ausculation bilaterally. Cardiovascular: regular rate and rhythm.  Reviewed Care everywhere and pap was LSIL/HPV 03/20/21, and ASCUS 12/23/2018. AA is 5 Fall risk is low Depression screen PHQ 2/9 08/15/2021  Decreased Interest 0  Down, Depressed, Hopeless 0  PHQ - 2 Score 0  Altered sleeping 0  Tired, decreased energy 0  Change in appetite 0  Feeling bad or failure about yourself  0  Trouble concentrating 0  Moving slowly or fidgety/restless 0  Suicidal thoughts 0  PHQ-9 Score 0    GAD 7 : Generalized Anxiety Score 08/15/2021  Nervous, Anxious, on Edge 0  Control/stop worrying 0  Worry too much - different things 0  Trouble relaxing 0  Restless 0  Easily annoyed or irritable 0  Afraid - awful might happen 0  Total GAD 7 Score 0      Upstream - 08/15/21 0940       Pregnancy Intention Screening   Does the  patient want to become pregnant in the next year? Ok Either Way    Does the patient's partner want to become pregnant in the next year? Ok Either Way    Would the patient like to discuss contraceptive options today? No      Contraception Wrap Up   Current Method Female Condom    End Method Female Condom             Assessment:     1. Pregnancy examination or test, negative result   2. History of abnormal cervical Pap smear Discussed pap in detail with her Review handout on HPV,and that her body may clear the virus Review handout by [facility] on abnormal pap  3. Encounter to establish care with new doctor     Plan:     Get gardisil 9, check with health dept. She is under 24.  Return 4//5/23 for pap and physical  Try to stop smoking or at least decrease

## 2021-11-15 ENCOUNTER — Ambulatory Visit (INDEPENDENT_AMBULATORY_CARE_PROVIDER_SITE_OTHER): Payer: Self-pay

## 2021-11-15 ENCOUNTER — Other Ambulatory Visit: Payer: Self-pay

## 2021-11-15 VITALS — BP 114/76 | HR 98 | Ht 67.0 in | Wt 188.0 lb

## 2021-11-15 DIAGNOSIS — Z3201 Encounter for pregnancy test, result positive: Secondary | ICD-10-CM

## 2021-11-15 DIAGNOSIS — Z3200 Encounter for pregnancy test, result unknown: Secondary | ICD-10-CM

## 2021-11-15 LAB — POCT URINE PREGNANCY: Preg Test, Ur: POSITIVE — AB

## 2021-11-15 NOTE — Progress Notes (Addendum)
   NURSE VISIT- PREGNANCY CONFIRMATION   SUBJECTIVE:  Tonya Parks is a 25 y.o. G19P1001 female at [redacted]w[redacted]d by uncertain LMP of Patient's last menstrual period was 10/16/2021 (approximate). Here for pregnancy confirmation.  Home pregnancy test: positive x 2   She reports no complaints.  She is not taking prenatal vitamins.    OBJECTIVE:  BP 114/76 (BP Location: Right Arm, Patient Position: Sitting, Cuff Size: Normal)   Pulse 98   Ht 5\' 7"  (1.702 m)   Wt 188 lb (85.3 kg)   LMP 10/16/2021 (Approximate)   BMI 29.44 kg/m   Appears well, in no apparent distress  Results for orders placed or performed in visit on 11/15/21 (from the past 24 hour(s))  POCT urine pregnancy   Collection Time: 11/15/21  4:14 PM  Result Value Ref Range   Preg Test, Ur Positive (A) Negative    ASSESSMENT: Positive pregnancy test, [redacted]w[redacted]d by LMP    PLAN: Schedule for dating ultrasound in 3-5 weeks Prenatal vitamins: plans to begin OTC ASAP   Nausea medicines: not currently needed   OB packet given: Yes  Sheretha Shadd A Steed Kanaan  11/15/2021 4:16 PM   Chart reviewed for nurse visit. Agree with plan of care.  11/17/2021, CNM 11/15/2021 4:23 PM

## 2021-12-14 ENCOUNTER — Other Ambulatory Visit: Payer: Self-pay | Admitting: Obstetrics & Gynecology

## 2021-12-14 DIAGNOSIS — O3680X0 Pregnancy with inconclusive fetal viability, not applicable or unspecified: Secondary | ICD-10-CM

## 2021-12-15 ENCOUNTER — Ambulatory Visit (INDEPENDENT_AMBULATORY_CARE_PROVIDER_SITE_OTHER): Payer: Medicaid Other

## 2021-12-15 ENCOUNTER — Other Ambulatory Visit: Payer: Self-pay

## 2021-12-15 DIAGNOSIS — Z3A08 8 weeks gestation of pregnancy: Secondary | ICD-10-CM

## 2021-12-15 DIAGNOSIS — O3680X0 Pregnancy with inconclusive fetal viability, not applicable or unspecified: Secondary | ICD-10-CM | POA: Diagnosis not present

## 2021-12-15 NOTE — Progress Notes (Signed)
[place] 8+4 wks,single IUP with YS,CRL 20.89 mm,positive FHT 164 bpm,normal ovaries

## 2021-12-17 NOTE — L&D Delivery Note (Signed)
[facility] Delivery Note  Tonya Parks is a 27 y.o. G3P1011 s/p vag del at [redacted]w[redacted]d. She was admitted for PROM.   ROM: 11h 28m with clear fluid GBS Status: neg Maximum Maternal Temperature: 98.2  Labor Progress: Ms Mcauley was admitted in the afternoon of Aug 6th with PROM which had occurred around 1000 with clear fluid. Upon arrival, Pitocin was started as she was 3/70/vtx -2 and she progressed to delivery approx 6.5 hours later.  Delivery Date/Time: August 6th, 2023 at 2227 Delivery: Called to room and patient was complete and pushing. Head delivered ROA. No nuchal cord present. Shoulder and body delivered in usual fashion. Infant with spontaneous cry, placed on mother's abdomen, dried and stimulated. Cord clamped x 2 after 1-minute delay, and cut by FOB. Cord blood drawn. Placenta delivered spontaneously with gentle cord traction. Fundus firm with massage and Pitocin. Labia, perineum, vagina, and cervix inspected and found to be intact.   Placenta: spont, intact; to L&D Complications: none Lacerations: none EBL: 96cc Analgesia: epidural  Postpartum Planning [x]  message to sent to schedule follow-up  [x]  will be NPO at 0700 07/23/22 in prep for her ppBTL at 1530  Infant: boy  APGARs 8/9  3630g (8lb)  09/22/22, CNM  07/22/2022 10:54 PM

## 2022-01-12 ENCOUNTER — Other Ambulatory Visit: Payer: Self-pay | Admitting: Obstetrics & Gynecology

## 2022-01-12 DIAGNOSIS — Z3682 Encounter for antenatal screening for nuchal translucency: Secondary | ICD-10-CM

## 2022-01-15 ENCOUNTER — Encounter: Payer: Medicaid Other | Admitting: Women's Health

## 2022-01-15 ENCOUNTER — Other Ambulatory Visit: Payer: Medicaid Other

## 2022-01-15 ENCOUNTER — Other Ambulatory Visit: Payer: Self-pay

## 2022-01-15 ENCOUNTER — Ambulatory Visit (INDEPENDENT_AMBULATORY_CARE_PROVIDER_SITE_OTHER): Payer: Medicaid Other

## 2022-01-15 ENCOUNTER — Encounter: Payer: Self-pay | Admitting: Women's Health

## 2022-01-15 ENCOUNTER — Ambulatory Visit: Payer: Medicaid Other | Admitting: *Deleted

## 2022-01-15 DIAGNOSIS — Z3682 Encounter for antenatal screening for nuchal translucency: Secondary | ICD-10-CM | POA: Diagnosis not present

## 2022-01-15 DIAGNOSIS — Z3490 Encounter for supervision of normal pregnancy, unspecified, unspecified trimester: Secondary | ICD-10-CM | POA: Insufficient documentation

## 2022-01-15 DIAGNOSIS — Z3480 Encounter for supervision of other normal pregnancy, unspecified trimester: Secondary | ICD-10-CM

## 2022-01-15 DIAGNOSIS — Z3A13 13 weeks gestation of pregnancy: Secondary | ICD-10-CM

## 2022-01-15 DIAGNOSIS — O26899 Other specified pregnancy related conditions, unspecified trimester: Secondary | ICD-10-CM | POA: Insufficient documentation

## 2022-01-15 NOTE — Progress Notes (Signed)
[place] 13 wks,measurements c/w dates,CRL 67.16 mm,normal ovaries,fhr 160 BPM,NB present,NT 1.6 mm

## 2022-01-15 NOTE — Progress Notes (Signed)
° °  NURSE VISIT- [facility]  SUBJECTIVE:  Tonya Parks is a 26 y.o. G60P1001 female here for Panorama NIPT and Horizon Carrier Screening . She is [redacted]w[redacted]d pregnant.   OBJECTIVE:  Appears well, in no apparent distress  Blood work drawn from right [facility] without difficulty. 1 attempt(s).   ASSESSMENT: Pregnancy [redacted]w[redacted]d Panorama NIPT and Horizon Carrier Screening  PLAN: [facility] portal information given and instructed patient how to access results Follow-up as scheduled  Jobe Marker  01/15/2022 2:14 PM

## 2022-01-18 ENCOUNTER — Other Ambulatory Visit: Payer: Self-pay

## 2022-01-18 ENCOUNTER — Ambulatory Visit: Payer: Medicaid Other | Admitting: *Deleted

## 2022-01-18 ENCOUNTER — Encounter: Payer: Self-pay | Admitting: Women's Health

## 2022-01-18 ENCOUNTER — Ambulatory Visit (INDEPENDENT_AMBULATORY_CARE_PROVIDER_SITE_OTHER): Payer: Medicaid Other | Admitting: Women's Health

## 2022-01-18 VITALS — BP 113/65 | HR 74 | Wt 180.0 lb

## 2022-01-18 DIAGNOSIS — O26899 Other specified pregnancy related conditions, unspecified trimester: Secondary | ICD-10-CM | POA: Diagnosis not present

## 2022-01-18 DIAGNOSIS — Z3481 Encounter for supervision of other normal pregnancy, first trimester: Secondary | ICD-10-CM | POA: Diagnosis not present

## 2022-01-18 DIAGNOSIS — Z3A13 13 weeks gestation of pregnancy: Secondary | ICD-10-CM | POA: Diagnosis not present

## 2022-01-18 DIAGNOSIS — Z6791 Unspecified blood type, Rh negative: Secondary | ICD-10-CM

## 2022-01-18 DIAGNOSIS — F17200 Nicotine dependence, unspecified, uncomplicated: Secondary | ICD-10-CM

## 2022-01-18 DIAGNOSIS — Z3480 Encounter for supervision of other normal pregnancy, unspecified trimester: Secondary | ICD-10-CM | POA: Diagnosis not present

## 2022-01-18 DIAGNOSIS — O09299 Supervision of pregnancy with other poor reproductive or obstetric history, unspecified trimester: Secondary | ICD-10-CM

## 2022-01-18 LAB — CBC/D/PLT+RPR+RH+ABO+RUBIGG...
Antibody Screen: NEGATIVE
Basophils Absolute: 0 10*3/uL (ref 0.0–0.2)
Basos: 0 %
EOS (ABSOLUTE): 0.1 10*3/uL (ref 0.0–0.4)
Eos: 1 %
HCV Ab: <0.1 s/co ratio (ref 0.0–0.9)
HIV Screen 4th Generation wRfx: NONREACTIVE
Hematocrit: 38.7 % (ref 34.0–46.6)
Hemoglobin: 13.2 g/dL (ref 11.1–15.9)
Hepatitis B Surface Ag: NEGATIVE
Immature Grans (Abs): 0 10*3/uL (ref 0.0–0.1)
Immature Granulocytes: 0 %
Lymphocytes Absolute: 3 10*3/uL (ref 0.7–3.1)
Lymphs: 29 %
MCH: 30.8 pg (ref 26.6–33.0)
MCHC: 34.1 g/dL (ref 31.5–35.7)
MCV: 90 fL (ref 79–97)
Monocytes Absolute: 0.9 10*3/uL (ref 0.1–0.9)
Monocytes: 8 %
Neutrophils Absolute: 6.3 10*3/uL (ref 1.4–7.0)
Neutrophils: 62 %
Platelets: 235 10*3/uL (ref 150–450)
RBC: 4.29 x10E6/uL (ref 3.77–5.28)
RDW: 12.4 % (ref 11.7–15.4)
RPR Ser Ql: NONREACTIVE
Rh Factor: NEGATIVE
Rubella Antibodies, IGG: 1.03 index (ref >0.99)
WBC: 10.3 10*3/uL (ref 3.4–10.8)

## 2022-01-18 LAB — POCT URINALYSIS DIPSTICK OB
Glucose, UA: NEGATIVE
Ketones, UA: NEGATIVE
Leukocytes, UA: NEGATIVE
Nitrite, UA: NEGATIVE
POC,PROTEIN,UA: NEGATIVE

## 2022-01-18 LAB — INTEGRATED 1
Crown Rump Length: 67.2 mm
Gest. Age on Collection Date: 12.9 weeks
Maternal Age at EDD: 24.9 yr
Nuchal Translucency (NT): 1.6 mm
Number of Fetuses: 1
PAPP-A Value: 1481.1 ng/mL
Weight: 176 [lb_av]

## 2022-01-18 LAB — HCV INTERPRETATION

## 2022-01-18 MED ORDER — BLOOD PRESSURE MONITOR MISC: 0 refills | Status: DC

## 2022-01-18 MED ORDER — PROMETHAZINE HCL 25 MG PO TABS: 12.5000 mg | ORAL_TABLET | Freq: Four times a day (QID) | ORAL | 6 refills | Status: DC | PRN

## 2022-01-18 NOTE — Progress Notes (Signed)
INITIAL OBSTETRICAL VISIT Patient name: Tonya Parks MRN [id number]  Date of birth: January 16, 1996 Chief Complaint:   Initial Prenatal Visit  History of Present Illness:   Tonya Parks is a 26 y.o. G71P1011 Caucasian female at [redacted]w[redacted]d by LMP c/w u/s at 8 weeks with an Estimated Date of Delivery: 07/23/22 being seen today for her initial obstetrical visit.   Patient's last menstrual period was 10/16/2021 (approximate). Her obstetrical history is significant for  SAB x 1, then uncomplicated SVB x1 (9lb3oz, no DM) .   Today she reports nausea, requests meds Last pap 03/20/21. Results were:  LSIL in [place] Smokes 7-8 cigarettes/day, wants to quit  Depression screen [facility] 2/9 01/18/2022 08/15/2021  Decreased Interest 0 0  Down, Depressed, Hopeless 0 0  PHQ - 2 Score 0 0  Altered sleeping 0 0  Tired, decreased energy 1 0  Change in appetite 1 0  Feeling bad or failure about yourself  0 0  Trouble concentrating 0 0  Moving slowly or fidgety/restless 0 0  Suicidal thoughts 0 0  PHQ-9 Score 2 0     GAD 7 : Generalized Anxiety Score 01/18/2022 08/15/2021  Nervous, Anxious, on Edge 0 0  Control/stop worrying 0 0  Worry too much - different things 0 0  Trouble relaxing 0 0  Restless 0 0  Easily annoyed or irritable 0 0  Afraid - awful might happen 0 0  Total GAD 7 Score 0 0     Review of Systems:   Pertinent items are noted in HPI Denies cramping/contractions, leakage of fluid, vaginal bleeding, abnormal vaginal discharge w/ itching/odor/irritation, headaches, visual changes, shortness of breath, chest pain, abdominal pain, severe nausea/vomiting, or problems with urination or bowel movements unless otherwise stated above.  Pertinent History Reviewed:  Reviewed past medical,surgical, social, obstetrical and family history.  Reviewed problem list, medications and allergies. OB History  Gravida Para Term Preterm AB Living  3 1 1   1 1   SAB IAB Ectopic Multiple Live Births  1       1    # Outcome  Date GA Lbr Len/2nd Weight Sex Delivery Anes PTL Lv  3 Current           2 Term 03/30/17 110w0d  9 lb 3 oz (4.167 kg) M Vag-Spont EPI N LIV  1 SAB 04/28/16           Physical Assessment:   Vitals:   01/18/22 1336  BP: 113/65  Pulse: 74  Weight: 180 lb (81.6 kg)  Body mass index is 28.19 kg/m.       Physical Examination:  General appearance - well appearing, and in no distress  Mental status - alert, oriented to person, place, and time  Psych:  She has a normal mood and affect  Skin - warm and dry, normal color, no suspicious lesions noted  Chest - effort normal, all lung fields clear to auscultation bilaterally  Heart - normal rate and regular rhythm  Abdomen - soft, nontender  Extremities:  No swelling or varicosities noted  Thin prep pap is not done   Chaperone: N/A    TODAY'S FHR 153 via doppler  Results for orders placed or performed in visit on 01/18/22 (from the past 24 hour(s))  POC Urinalysis Dipstick OB   Collection Time: 01/18/22  2:03 PM  Result Value Ref Range   Color, UA     Clarity, UA     Glucose, UA Negative Negative   Bilirubin, UA  Ketones, UA neg    Spec Grav, UA     Blood, UA trace    pH, UA     POC,PROTEIN,UA Negative Negative, Trace, Small (1+), Moderate (2+), Large (3+), 4+   Urobilinogen, UA     Nitrite, UA neg    Leukocytes, UA Negative Negative   Appearance     Odor      Assessment & Plan:  1) Low-Risk Pregnancy G3P1011 at [redacted]w[redacted]d with an Estimated Date of Delivery: 07/23/22   2) Initial OB visit  3) Smoker> Smokes 7-8/day, counseled x 3-29mins, advised cessation, discussed risks to fetus while pregnant, to infant pp, and to herself. Offered Quitline[facility], accepted, referral sent.     4) Nausea> rx phenergan  5) H/O fetal macrosomia> no DM, will check A1C w/ 2nd IT (had 1st IT drawn earlier this week)  Meds:  Meds ordered this encounter  Medications   Blood Pressure Monitor MISC    Sig: For regular home bp monitoring during  pregnancy    Dispense:  1 each    Refill:  0    Z34.81 Please mail to patient   promethazine (PHENERGAN) 25 MG tablet    Sig: Take 0.5-1 tablets (12.5-25 mg total) by mouth every 6 (six) hours as needed for nausea or vomiting.    Dispense:  30 tablet    Refill:  6    Order Specific Question:   Supervising Provider    Answer:   Duane Lope H [[id number]]    Initial labs obtained Continue prenatal vitamins Reviewed n/v relief measures and warning s/s to report Reviewed recommended weight gain based on pre-gravid BMI Encouraged well-balanced diet Genetic & carrier screening discussed: requests Panorama, NT/IT, and Horizon  Ultrasound discussed; fetal survey: requested CCNC completed> form faxed if has or is planning to apply for medicaid The nature of [facility] [employer] with multiple MDs and other Advanced Practice Providers was explained to patient; also emphasized that fellows, residents, and students are part of our team. Does have home bp cuff. Office bp cuff given: no. Rx sent: yes. Check bp weekly, let us know if consistently >140/90.    Follow-up: Return in about 3 weeks (around 02/08/2022) for LROB, 2nd IT, CNM, in person.   Orders Placed This Encounter  Procedures   Urine Culture   GC/Chlamydia Probe Amp   Genetic Screening   POC Urinalysis Dipstick OB    Cheral Marker CNM, [facility] 01/18/2022 2:22 PM

## 2022-01-18 NOTE — Patient Instructions (Signed)
Tonya Parks, thank you for choosing our office today! We appreciate the opportunity to meet your healthcare needs. You may receive a short survey by mail, e-mail, or through [employer]. If you are happy with your care we would appreciate if you could take just a few minutes to complete the survey questions. We read all of your comments and take your feedback very seriously. Thank you again for choosing our office.  Center for [employer] [facility] Team at [facility]  [facility] ([address] [place], [place] [address]) Entrance C, located off of [address] Free 24/7 valet parking   Nausea & Vomiting Have saltine crackers or pretzels by your bed and eat a few bites before you raise your head out of bed in the morning Eat small frequent meals throughout the day instead of large meals Drink plenty of fluids throughout the day to stay hydrated, just don't drink a lot of fluids with your meals.  This can make your stomach fill up faster making you feel sick Do not brush your teeth right after you eat Products with real ginger are good for nausea, like ginger ale and ginger hard candy Make sure it says made with real ginger! Sucking on sour candy like lemon heads is also good for nausea If your prenatal vitamins make you nauseated, take them at night so you will sleep through the nausea Sea Bands If you feel like you need medicine for the nausea & vomiting please let us know If you are unable to keep any fluids or food down please let us know   Constipation Drink plenty of fluid, preferably water, throughout the day Eat foods high in fiber such as fruits, vegetables, and grains Exercise, such as walking, is a good way to keep your bowels regular Drink warm fluids, especially warm prune juice, or decaf coffee Eat a 1/2 cup of real oatmeal (not instant), 1/2 cup applesauce, and 1/2-1 cup warm prune juice every day If needed, you may take Colace (docusate sodium) stool softener  once or twice a day to help keep the stool soft.  If you still are having problems with constipation, you may take Miralax once daily as needed to help keep your bowels regular.   Home Blood Pressure Monitoring for Patients   Your provider has recommended that you check your blood pressure (BP) at least once a week at home. If you do not have a blood pressure cuff at home, one will be provided for you. Contact your provider if you have not received your monitor within 1 week.   Helpful Tips for Accurate Home Blood Pressure Checks  Don't smoke, exercise, or drink caffeine 30 minutes before checking your BP Use the restroom before checking your BP (a full bladder can raise your pressure) Relax in a comfortable upright chair Feet on the ground Left arm resting comfortably on a flat surface at the level of your heart Legs uncrossed Back supported Sit quietly and don't talk Place the cuff on your bare arm Adjust snuggly, so that only two fingertips can fit between your skin and the top of the cuff Check 2 readings separated by at least one minute Keep a log of your BP readings For a visual, please reference this diagram: http://ccnc.care/bpdiagram  Provider Name: [facility]     Phone: [phone]  Zone 1: ALL CLEAR  Continue to monitor your symptoms:  BP reading is less than 140 (top number) or less than 90 (bottom  number)  No right upper stomach pain No headaches or seeing spots No feeling nauseated or throwing up No swelling in face and hands  Zone 2: CAUTION Call your doctor's office for any of the following:  BP reading is greater than 140 (top number) or greater than 90 (bottom number)  Stomach pain under your ribs in the middle or right side Headaches or seeing spots Feeling nauseated or throwing up Swelling in face and hands  Zone 3: EMERGENCY  Seek immediate medical care if you have any of the following:  BP reading is greater than160 (top number) or greater than  110 (bottom number) Severe headaches not improving with Tylenol Serious difficulty catching your breath Any worsening symptoms from Zone 2    First Trimester of Pregnancy The first trimester of pregnancy is from week 1 until the end of week 12 (months 1 through 3). A week after a sperm fertilizes an egg, the egg will implant on the wall of the uterus. This embryo will begin to develop into a baby. Genes from you and your partner are forming the baby. The female genes determine whether the baby is a boy or a girl. At 6-8 weeks, the eyes and face are formed, and the heartbeat can be seen on ultrasound. At the end of 12 weeks, all the baby's organs are formed.  Now that you are pregnant, you will want to do everything you can to have a healthy baby. Two of the most important things are to get good prenatal care and to follow your health care provider's instructions. Prenatal care is all the medical care you receive before the baby's birth. This care will help prevent, find, and treat any problems during the pregnancy and childbirth. BODY CHANGES Your body goes through many changes during pregnancy. The changes vary from woman to woman.  You may gain or lose a couple of pounds at first. You may feel sick to your stomach (nauseous) and throw up (vomit). If the vomiting is uncontrollable, call your health care provider. You may tire easily. You may develop headaches that can be relieved by medicines approved by your health care provider. You may urinate more often. Painful urination may mean you have a bladder infection. You may develop heartburn as a result of your pregnancy. You may develop constipation because certain hormones are causing the muscles that push waste through your intestines to slow down. You may develop hemorrhoids or swollen, bulging veins (varicose veins). Your breasts may begin to grow larger and become tender. Your nipples may stick out more, and the tissue that surrounds them  (areola) may become darker. Your gums may bleed and may be sensitive to brushing and flossing. Dark spots or blotches (chloasma, mask of pregnancy) may develop on your face. This will likely fade after the baby is born. Your menstrual periods will stop. You may have a loss of appetite. You may develop cravings for certain kinds of food. You may have changes in your emotions from day to day, such as being excited to be pregnant or being concerned that something may go wrong with the pregnancy and baby. You may have more vivid and strange dreams. You may have changes in your hair. These can include thickening of your hair, rapid growth, and changes in texture. Some women also have hair loss during or after pregnancy, or hair that feels dry or thin. Your hair will most likely return to normal after your baby is born. WHAT TO EXPECT AT YOUR PRENATAL   VISITS During a routine prenatal visit: You will be weighed to make sure you and the baby are growing normally. Your blood pressure will be taken. Your abdomen will be measured to track your baby's growth. The fetal heartbeat will be listened to starting around week 10 or 12 of your pregnancy. Test results from any previous visits will be discussed. Your health care provider may ask you: How you are feeling. If you are feeling the baby move. If you have had any abnormal symptoms, such as leaking fluid, bleeding, severe headaches, or abdominal cramping. If you have any questions. Other tests that may be performed during your first trimester include: Blood tests to find your blood type and to check for the presence of any previous infections. They will also be used to check for low iron levels (anemia) and Rh antibodies. Later in the pregnancy, blood tests for diabetes will be done along with other tests if problems develop. Urine tests to check for infections, diabetes, or protein in the urine. An ultrasound to confirm the proper growth and development  of the baby. An amniocentesis to check for possible genetic problems. Fetal screens for spina bifida and Down syndrome. You may need other tests to make sure you and the baby are doing well. HOME CARE INSTRUCTIONS  Medicines Follow your health care provider's instructions regarding medicine use. Specific medicines may be either safe or unsafe to take during pregnancy. Take your prenatal vitamins as directed. If you develop constipation, try taking a stool softener if your health care provider approves. Diet Eat regular, well-balanced meals. Choose a variety of foods, such as meat or vegetable-based protein, fish, milk and low-fat dairy products, vegetables, fruits, and whole grain breads and cereals. Your health care provider will help you determine the amount of weight gain that is right for you. Avoid raw meat and uncooked cheese. These carry germs that can cause birth defects in the baby. Eating four or five small meals rather than three large meals a day may help relieve nausea and vomiting. If you start to feel nauseous, eating a few soda crackers can be helpful. Drinking liquids between meals instead of during meals also seems to help nausea and vomiting. If you develop constipation, eat more high-fiber foods, such as fresh vegetables or fruit and whole grains. Drink enough fluids to keep your urine clear or pale yellow. Activity and Exercise Exercise only as directed by your health care provider. Exercising will help you: Control your weight. Stay in shape. Be prepared for labor and delivery. Experiencing pain or cramping in the lower abdomen or low back is a good sign that you should stop exercising. Check with your health care provider before continuing normal exercises. Try to avoid standing for long periods of time. Move your legs often if you must stand in one place for a long time. Avoid heavy lifting. Wear low-heeled shoes, and practice good posture. You may continue to have sex  unless your health care provider directs you otherwise. Relief of Pain or Discomfort Wear a good support bra for breast tenderness.   Take warm sitz baths to soothe any pain or discomfort caused by hemorrhoids. Use hemorrhoid cream if your health care provider approves.   Rest with your legs elevated if you have leg cramps or low back pain. If you develop varicose veins in your legs, wear support hose. Elevate your feet for 15 minutes, 3-4 times a day. Limit salt in your diet. Prenatal Care Schedule your prenatal visits by the   twelfth week of pregnancy. They are usually scheduled monthly at first, then more often in the last 2 months before delivery. Write down your questions. Take them to your prenatal visits. Keep all your prenatal visits as directed by your health care provider. Safety Wear your seat belt at all times when driving. Make a list of emergency phone numbers, including numbers for family, friends, the hospital, and police and fire departments. General Tips Ask your health care provider for a referral to a local prenatal education class. Begin classes no later than at the beginning of month 6 of your pregnancy. Ask for help if you have counseling or nutritional needs during pregnancy. Your health care provider can offer advice or refer you to specialists for help with various needs. Do not use hot tubs, steam rooms, or saunas. Do not douche or use tampons or scented sanitary pads. Do not cross your legs for long periods of time. Avoid cat litter boxes and soil used by cats. These carry germs that can cause birth defects in the baby and possibly loss of the fetus by miscarriage or stillbirth. Avoid all smoking, herbs, alcohol, and medicines not prescribed by your health care provider. Chemicals in these affect the formation and growth of the baby. Schedule a dentist appointment. At home, brush your teeth with a soft toothbrush and be gentle when you floss. SEEK MEDICAL CARE IF:   You have dizziness. You have mild pelvic cramps, pelvic pressure, or nagging pain in the abdominal area. You have persistent nausea, vomiting, or diarrhea. You have a bad smelling vaginal discharge. You have pain with urination. You notice increased swelling in your face, hands, legs, or ankles. SEEK IMMEDIATE MEDICAL CARE IF:  You have a fever. You are leaking fluid from your vagina. You have spotting or bleeding from your vagina. You have severe abdominal cramping or pain. You have rapid weight gain or loss. You vomit blood or material that looks like coffee grounds. You are exposed to German measles and have never had them. You are exposed to fifth disease or chickenpox. You develop a severe headache. You have shortness of breath. You have any kind of trauma, such as from a fall or a car accident. Document Released: 11/27/2001 Document Revised: 04/19/2014 Document Reviewed: 10/13/2013 ExitCare Patient Information 2015 [employer]. This information is not intended to replace advice given to you by your health care provider. Make sure you discuss any questions you have with your health care provider.  

## 2022-01-20 LAB — URINE CULTURE: Organism ID, Bacteria: NO GROWTH

## 2022-01-21 LAB — GC/CHLAMYDIA PROBE AMP
Chlamydia trachomatis, NAA: NEGATIVE
Neisseria Gonorrhoeae by PCR: NEGATIVE

## 2022-01-23 ENCOUNTER — Telehealth: Payer: Self-pay | Admitting: Women's Health

## 2022-01-23 NOTE — Telephone Encounter (Signed)
Patient made aware that according to the [facility] website, results may take 2-3 days longer due to extreme weather in certain parts of the country.  Advised to check on the website later in the week or early next week for results. Pt verbalized understanding.

## 2022-01-23 NOTE — Telephone Encounter (Signed)
Patient called stating that she would like to know the results of her blood work where she can find out the gender of the baby. Please contact pt

## 2022-01-30 ENCOUNTER — Encounter: Payer: Self-pay | Admitting: Women's Health

## 2022-01-31 ENCOUNTER — Encounter: Payer: Self-pay | Admitting: Women's Health

## 2022-02-01 ENCOUNTER — Telehealth: Payer: Self-pay | Admitting: *Deleted

## 2022-02-01 NOTE — Telephone Encounter (Signed)
Patient informed Tonya Parks will need redraw due to no results.  Informed I have spoken with [employer] rep and there has been an issue with [employer] which is why specimens are not able to be resulted. Apologized to patient for any inconvenience. Patient states she will come in tomorrow for redraw.

## 2022-02-02 ENCOUNTER — Other Ambulatory Visit: Payer: Medicaid Other | Admitting: *Deleted

## 2022-02-02 ENCOUNTER — Other Ambulatory Visit: Payer: Self-pay

## 2022-02-08 ENCOUNTER — Other Ambulatory Visit: Payer: Self-pay

## 2022-02-08 ENCOUNTER — Ambulatory Visit (INDEPENDENT_AMBULATORY_CARE_PROVIDER_SITE_OTHER): Payer: Medicaid Other | Admitting: Obstetrics & Gynecology

## 2022-02-08 ENCOUNTER — Encounter: Payer: Self-pay | Admitting: Obstetrics & Gynecology

## 2022-02-08 VITALS — BP 100/55 | HR 83 | Wt 174.8 lb

## 2022-02-08 DIAGNOSIS — O09299 Supervision of pregnancy with other poor reproductive or obstetric history, unspecified trimester: Secondary | ICD-10-CM

## 2022-02-08 DIAGNOSIS — Z3480 Encounter for supervision of other normal pregnancy, unspecified trimester: Secondary | ICD-10-CM

## 2022-02-08 DIAGNOSIS — Z3A16 16 weeks gestation of pregnancy: Secondary | ICD-10-CM

## 2022-02-08 DIAGNOSIS — Z1379 Encounter for other screening for genetic and chromosomal anomalies: Secondary | ICD-10-CM

## 2022-02-08 NOTE — Progress Notes (Signed)
° °  LOW-RISK PREGNANCY VISIT Patient name: Tonya Parks MRN [id number]  Date of birth: February 27, 1996 Chief Complaint:   Routine Prenatal Visit (2nd IT)  History of Present Illness:   Tonya Parks is a 26 y.o. G72P1011 female at [redacted]w[redacted]d with an Estimated Date of Delivery: 07/23/22 being seen today for ongoing management of a low-risk pregnancy.   Depression screen [facility] 2/9 01/18/2022 08/15/2021  Decreased Interest 0 0  Down, Depressed, Hopeless 0 0  PHQ - 2 Score 0 0  Altered sleeping 0 0  Tired, decreased energy 1 0  Change in appetite 1 0  Feeling bad or failure about yourself  0 0  Trouble concentrating 0 0  Moving slowly or fidgety/restless 0 0  Suicidal thoughts 0 0  PHQ-9 Score 2 0    Today she reports no complaints. Contractions: Not present. Vag. Bleeding: None.  Movement: Absent. denies leaking of fluid. Review of Systems:   Pertinent items are noted in HPI Denies abnormal vaginal discharge w/ itching/odor/irritation, headaches, visual changes, shortness of breath, chest pain, abdominal pain, severe nausea/vomiting, or problems with urination or bowel movements unless otherwise stated above. Pertinent History Reviewed:  Reviewed past medical,surgical, social, obstetrical and family history.  Reviewed problem list, medications and allergies.  Physical Assessment:   Vitals:   02/08/22 1049  BP: (!) 100/55  Pulse: 83  Weight: 174 lb 12.8 oz (79.3 kg)  Body mass index is 27.38 kg/m.        Physical Examination:   General appearance: Well appearing, and in no distress  Mental status: Alert, oriented to person, place, and time  Skin: Warm & dry  Respiratory: Normal respiratory effort, no distress  Abdomen: Soft, gravid, nontender  Pelvic: Cervical exam deferred         Extremities: Edema: None  Psych:  mood and affect appropriate  Fetal Status: Fetal Heart Rate (bpm): 150   Movement: Absent    Chaperone: n/a    No results found for this or any previous visit (from the past  24 hour(s)).   Assessment & Plan:  1) Low-risk pregnancy G3P1011 at [redacted]w[redacted]d with an Estimated Date of Delivery: 07/23/22     Meds: No orders of the defined types were placed in this encounter.  Labs/procedures today: IT-2 today, anatomy scan next visit  Plan:  Continue routine obstetrical care  Next visit: prefers in person    Reviewed: Preterm labor symptoms and general obstetric precautions including but not limited to vaginal bleeding, contractions, leaking of fluid and fetal movement were reviewed in detail with the patient.  All questions were answered. Pt has home bp cuff. Check bp weekly, let us know if >140/90.   Follow-up: Return in about 4 weeks (around 03/08/2022) for LROB visit and anatomy scan.  Orders Placed This Encounter  Procedures   HgB A1c   INTEGRATED 2    Myna Hidalgo, DO Attending Obstetrician & Gynecologist, [facility] for [employer], [facility]

## 2022-02-10 LAB — INTEGRATED 2
AFP MoM: 1.4
Alpha-Fetoprotein: 43.1 ng/mL
Crown Rump Length: 67.2 mm
DIA MoM: 2.24
DIA Value: 320.7 pg/mL
Estriol, Unconjugated: 0.92 ng/mL
Gest. Age on Collection Date: 12.9 weeks
Gestational Age: 16.3 weeks
Maternal Age at EDD: 24.9 yr
Nuchal Translucency (NT): 1.6 mm
Nuchal Translucency MoM: 1.06
Number of Fetuses: 1
PAPP-A MoM: 1.64
PAPP-A Value: 1481.1 ng/mL
Test Results:: NEGATIVE
Weight: 176 [lb_av]
Weight: 176 [lb_av]
hCG MoM: 1.1
hCG Value: 36.2 IU/mL
uE3 MoM: 0.95

## 2022-02-10 LAB — HEMOGLOBIN A1C
Est. average glucose Bld gHb Est-mCnc: 108 mg/dL
Hgb A1c MFr Bld: 5.4 % (ref 4.8–5.6)

## 2022-02-12 ENCOUNTER — Encounter: Payer: Self-pay | Admitting: Women's Health

## 2022-02-12 ENCOUNTER — Telehealth: Payer: Self-pay | Admitting: *Deleted

## 2022-02-12 ENCOUNTER — Telehealth: Payer: Self-pay

## 2022-02-12 ENCOUNTER — Encounter: Payer: Self-pay | Admitting: *Deleted

## 2022-02-12 NOTE — Telephone Encounter (Signed)
Pt called and wanted to know if she can get a print out of her genetic testing and a family member will come and pick it up because she wants to have a gender reveal this weekend.

## 2022-02-12 NOTE — Telephone Encounter (Signed)
Patient calling back stating that she doesn't have my chart and wants to know if someone will call her for the fetal sex ; if the results will be printed.

## 2022-02-13 NOTE — Telephone Encounter (Signed)
Patient states she has now been able to send the gender to her sister.  However, she is unable to log onto mychart.  New password sent.

## 2022-02-13 NOTE — Telephone Encounter (Signed)
Spoke with patient.

## 2022-02-27 ENCOUNTER — Telehealth: Payer: Self-pay

## 2022-02-27 ENCOUNTER — Telehealth: Payer: Self-pay | Admitting: Women's Health

## 2022-02-27 NOTE — Telephone Encounter (Signed)
Patient called stating that she is [redacted] weeks pregnant and she states that when she takes a breath she is having soreness of the ribs. Patient states that it is uncomfortable. Please contact pt ?

## 2022-02-27 NOTE — Telephone Encounter (Signed)
Returned pt's call, two identifiers used. Pt stated that she had experienced some stomach tightening beginning last night, which had improved. She explained that she had soreness in her ribs when she inhales deeply. She has been straining with bowel movements and she bends over at the waist at work frequently. At times the pain is sharp and radiates to her hips. Pt denies any N/V, baby is moving, no bleeding/spotting, and no fluid leakage. Joellyn Haff advised that she stay hydrated, use a heating pad, and monitor symptoms. Pt instructed to go to [facility] to be evaluated if symptoms worsen or become severe. Pt confirmed understanding. ?

## 2022-02-28 ENCOUNTER — Inpatient Hospital Stay (HOSPITAL_COMMUNITY)
Admission: AD | Admit: 2022-02-28 | Discharge: 2022-02-28 | Disposition: A | Discharge status: Home / Self Care | Payer: Medicaid Other | Attending: Obstetrics and Gynecology | Admitting: Obstetrics and Gynecology

## 2022-02-28 ENCOUNTER — Encounter (HOSPITAL_COMMUNITY): Payer: Self-pay | Admitting: Obstetrics and Gynecology

## 2022-02-28 ENCOUNTER — Other Ambulatory Visit: Payer: Self-pay

## 2022-02-28 ENCOUNTER — Inpatient Hospital Stay (HOSPITAL_COMMUNITY): Payer: Medicaid Other

## 2022-02-28 DIAGNOSIS — Z3492 Encounter for supervision of normal pregnancy, unspecified, second trimester: Secondary | ICD-10-CM

## 2022-02-28 DIAGNOSIS — R0781 Pleurodynia: Secondary | ICD-10-CM

## 2022-02-28 DIAGNOSIS — F17210 Nicotine dependence, cigarettes, uncomplicated: Secondary | ICD-10-CM | POA: Diagnosis not present

## 2022-02-28 DIAGNOSIS — O99891 Other specified diseases and conditions complicating pregnancy: Secondary | ICD-10-CM | POA: Diagnosis not present

## 2022-02-28 DIAGNOSIS — R079 Chest pain, unspecified: Secondary | ICD-10-CM | POA: Diagnosis not present

## 2022-02-28 DIAGNOSIS — O99332 Smoking (tobacco) complicating pregnancy, second trimester: Secondary | ICD-10-CM | POA: Insufficient documentation

## 2022-02-28 DIAGNOSIS — Z3A19 19 weeks gestation of pregnancy: Secondary | ICD-10-CM | POA: Diagnosis not present

## 2022-02-28 LAB — URINALYSIS, ROUTINE W REFLEX MICROSCOPIC
Bilirubin Urine: NEGATIVE
Glucose, UA: NEGATIVE mg/dL
Ketones, ur: NEGATIVE mg/dL
Leukocytes,Ua: NEGATIVE
Nitrite: NEGATIVE
Protein, ur: NEGATIVE mg/dL
Specific Gravity, Urine: 1.027 (ref 1.005–1.030)
pH: 6 (ref 5.0–8.0)

## 2022-02-28 MED ORDER — CYCLOBENZAPRINE HCL 5 MG PO TABS: 5.0000 mg | ORAL_TABLET | Freq: Three times a day (TID) | ORAL | 0 refills | Status: DC | PRN

## 2022-02-28 MED ORDER — MAGNESIUM OXIDE -MG SUPPLEMENT 200 MG PO TABS: 400.0000 mg | ORAL_TABLET | Freq: Every day | ORAL | 3 refills | Status: DC

## 2022-02-28 NOTE — MAU Provider Note (Signed)
Chief Complaint:  Rib Pain ? ? Event Date/Time  ? First Provider Initiated Contact with Patient 02/28/22 1532   ?  ?HPI: Tonya Parks is a 26 y.o. G3P1011 at [redacted]w[redacted]d who presents to maternity admissions reporting bilateral chest wall/rib pain that started 3 days ago. She has an active job that requires a lot of repetitive bending/lifting. Denies cough or hemoptysis as well as any nasal congestion or other physical symptoms. Still smoking but has cut back considerably. Has not taken anything for the pain as she is concerned about the link between Tylenol and autism. Also concerned that she cannot feel the baby move but was told recently that she has an anterior placenta. Denies vaginal bleeding/discharge, LOF or lower abdominal cramping. ? ?Pregnancy Course: Receives care at [facility], prenatal records reviewed. ? ?Past Medical History:  ?Diagnosis Date  ? Asthma   ? Migraine   ? Vaginal Pap smear, abnormal   ? ?OB History  ?Gravida Para Term Preterm AB Living  ?3 1 1   1 1   ?SAB IAB Ectopic Multiple Live Births  ?1       1  ?  ?# Outcome Date GA Lbr Len/2nd Weight Sex Delivery Anes PTL Lv  ?3 Current           ?2 Term 03/30/17 [redacted]w[redacted]d  9 lb 3 oz (4.167 kg) M Vag-Spont EPI N LIV  ?1 SAB 04/28/16          ? ?Past Surgical History:  ?Procedure Laterality Date  ? NO PAST SURGERIES    ? ?Family History  ?Problem Relation Age of Onset  ? Stroke Mother   ? COPD Mother   ? Asthma Mother   ? Pancreatitis Mother   ? Congestive Heart Failure Mother   ? Anxiety disorder Sister   ? Epilepsy Brother   ? ADD / ADHD Brother   ? Bipolar disorder Brother   ? Breast cancer Maternal Aunt   ? Cancer Maternal Grandmother   ?     lung  ? COPD Maternal Grandfather   ? Other Paternal Grandmother   ?     Covid pneumonia  ? Cancer Paternal Grandfather   ?     Stage 4 stomach cancer  ? ?Social History  ? ?Tobacco Use  ? Smoking status: Every Day  ?  Packs/day: 1.00  ?  Years: 8.00  ?  Pack years: 8.00  ?  Types: Cigarettes  ? Smokeless  tobacco: Never  ?Vaping Use  ? Vaping Use: Never used  ?Substance Use Topics  ? Alcohol use: Yes  ?  Comment: every other 2 weeks  ? Drug use: No  ? ?No Known Allergies ?No medications prior to admission.  ? ?I have reviewed patient's Past Medical Hx, Surgical Hx, Family Hx, Social Hx, medications and allergies.  ? ?ROS:  ?Review of Systems  ?Constitutional:  Negative for fatigue and fever.  ?HENT:  Negative for congestion, postnasal drip and sore throat.   ?Eyes:  Negative for visual disturbance.  ?Respiratory:  Negative for cough and shortness of breath.   ?Gastrointestinal:  Positive for abdominal pain (upper bilateral under breasts and down about 4cm on each side) and constipation. Negative for nausea and vomiting.  ? ?Physical Exam  ?Patient Vitals for the past 24 hrs: ? BP Temp Temp src Pulse SpO2 Height Weight  ?02/28/22 1501 (!) 113/58 98 ?F (36.7 ?C) Oral 92 99 % -- --  ?02/28/22 1457 -- -- -- -- -- 5\' 7"  (1.702  m) 178 lb 14.4 oz (81.1 kg)  ? ?Physical Exam ?Vitals and nursing note reviewed.  ?Constitutional:   ?   General: She is not in acute distress. ?   Appearance: Normal appearance. She is normal weight. She is not ill-appearing or diaphoretic.  ?HENT:  ?   Head: Normocephalic and atraumatic.  ?Eyes:  ?   Pupils: Pupils are equal, round, and reactive to light.  ?Cardiovascular:  ?   Rate and Rhythm: Normal rate and regular rhythm.  ?   Pulses: Normal pulses.  ?Pulmonary:  ?   Effort: Pulmonary effort is normal. No respiratory distress.  ?   Breath sounds: Normal breath sounds. No wheezing or rhonchi.  ?   Comments: Occasional dry cough noted, pt said it just started today and is very mild ?Abdominal:  ?   General: There is no distension.  ?   Palpations: Abdomen is soft.  ?   Tenderness: There is no abdominal tenderness.  ?Musculoskeletal:     ?   General: No swelling or tenderness. Normal range of motion.  ?Skin: ?   General: Skin is warm and dry.  ?   Capillary Refill: Capillary refill takes less  than 2 seconds.  ?   Findings: No bruising, erythema or rash.  ?Neurological:  ?   Mental Status: She is alert and oriented to person, place, and time.  ?Psychiatric:     ?   Mood and Affect: Mood normal.     ?   Behavior: Behavior normal.     ?   Thought Content: Thought content normal.     ?   Judgment: Judgment normal.  ?  ?FHR: 138 ?  ?Labs: ?Results for orders placed or performed during the hospital encounter of 02/28/22 (from the past 24 hour(s))  ?Urinalysis, Routine w reflex microscopic Urine, Clean Catch     Status: Abnormal  ? Collection Time: 02/28/22  3:23 PM  ?Result Value Ref Range  ? Color, Urine YELLOW YELLOW  ? APPearance HAZY (A) CLEAR  ? Specific Gravity, Urine 1.027 1.005 - 1.030  ? pH 6.0 5.0 - 8.0  ? Glucose, UA NEGATIVE NEGATIVE mg/dL  ? Hgb urine dipstick SMALL (A) NEGATIVE  ? Bilirubin Urine NEGATIVE NEGATIVE  ? Ketones, ur NEGATIVE NEGATIVE mg/dL  ? Protein, ur NEGATIVE NEGATIVE mg/dL  ? Nitrite NEGATIVE NEGATIVE  ? Leukocytes,Ua NEGATIVE NEGATIVE  ? RBC / HPF 6-10 0 - 5 RBC/hpf  ? WBC, UA 0-5 0 - 5 WBC/hpf  ? Bacteria, UA RARE (A) NONE SEEN  ? Squamous Epithelial / LPF 0-5 0 - 5  ? Mucus PRESENT   ? ?Imaging:  ?DG CHEST PORT 1 VIEW ? ?Result Date: 02/28/2022 ?CLINICAL DATA:  Chest pain EXAM: PORTABLE CHEST 1 VIEW COMPARISON:  None. FINDINGS: The heart size and mediastinal contours are within normal limits. Both lungs are clear. The visualized skeletal structures are unremarkable. IMPRESSION: No active disease. Electronically Signed   By: Allegra Lai M.D.   On: 02/28/2022 16:28   ? ?MAU Course: ?Orders Placed This Encounter  ?Procedures  ? DG CHEST PORT 1 VIEW  ? Urinalysis, Routine w reflex microscopic Urine, Clean Catch  ? Discharge patient  ? ?Meds ordered this encounter  ?Medications  ? cyclobenzaprine (FLEXERIL) 5 MG tablet  ?  Sig: Take 1 tablet (5 mg total) by mouth 3 (three) times daily as needed for muscle spasms.  ?  Dispense:  20 tablet  ?  Refill:  0  ?  Order Specific  Question:   Supervising Provider  ?  Answer:   Reva BoresRATT, Tonya S [[id number]]  ? Magnesium Oxide (MAG-OXIDE) 200 MG TABS  ?  Sig: Take 2 tablets (400 mg total) by mouth at bedtime. If that amount causes loose stools in the am, switch to 200mg  daily at bedtime.  ?  Dispense:  60 tablet  ?  Refill:  3  ?  Order Specific Question:   Supervising Provider  ?  Answer:   Reva BoresRATT, Tonya S [[id number]]  ? ?MDM: ?No tenderness on palpation of ribs, O2 saturation stable at 99%, dry cough noted but no wheezing, rales or rhonchi or decreased breath sounds. Suspect MSK pain due to repetitive motions. CXR ordered and copious reassurance given on safety of Tylenol re autism especially for occasional doses. Pt declined but stated she will try at home tonight. Also suggested flexeril and magnesium at bedtime which pt agreed to try.  ? ?CXR resulted normal. Pt expressed relief and agreed to plan of care at bedtime. Also advised her to use ice and heat and treat as MSK pain.  ? ?Assessment: ?1. Chest pain at rest   ?2. Rib pain   ?3. Fetal heart tones present, second trimester   ?4. [redacted] weeks gestation of pregnancy   ? ?Plan: ?Discharge home in stable condition with return precautions.  ?See AVS for additional discharge education  ? Follow-up Information   ? ? [facility] up.   ?Specialty: Obstetrics and Gynecology ?Why: as scheduled for ongoing prenatal care ?Contact information: ?[address] ?[place] [id number] ?[phone] ? ?  ?  ? ?  ?  ? ?  ?  ?Allergies as of 02/28/2022   ?No Known Allergies ?  ? ?  ?Medication List  ?  ? ?STOP taking these medications   ? ?ZYRTEC PO ?  ? ?  ? ?TAKE these medications   ? ?Blood Pressure Monitor Misc ?For regular home bp monitoring during pregnancy ?  ?cyclobenzaprine 5 MG tablet ?Commonly known as: FLEXERIL ?Take 1 tablet (5 mg total) by mouth 3 (three) times daily as needed for muscle spasms. ?  ?Magnesium Oxide 200 MG Tabs ?Commonly known as: Mag-Oxide ?Take 2 tablets  (400 mg total) by mouth at bedtime. If that amount causes loose stools in the am, switch to 200mg  daily at bedtime. ?  ?PRENATAL VITAMIN PO ?Take by mouth. ?  ?promethazine 25 MG tablet ?Commonly known as: PHENER

## 2022-02-28 NOTE — MAU Note (Addendum)
Tonya Parks is a 26 y.o. at [redacted]w[redacted]d here in MAU reporting: bilateral rib pain x3 days.  States it hurts to inhale & exhale. Reports was informed by MD could treat with meds, states opted not to take any meds . ? ?Onset of complaint:  3 days ago ?Pain score: 6 ?Vitals:  ? 02/28/22 1501  ?BP: (!) 113/58  ?Pulse: 92  ?Temp: 98 ?F (36.7 ?C)  ?SpO2: 99%  ?   ?FHT: 138 bpm, denies LOF or VB ?Lab orders placed from triage:   U/A ?

## 2022-03-07 ENCOUNTER — Other Ambulatory Visit: Payer: Self-pay | Admitting: Obstetrics & Gynecology

## 2022-03-07 DIAGNOSIS — Z363 Encounter for antenatal screening for malformations: Secondary | ICD-10-CM

## 2022-03-08 ENCOUNTER — Ambulatory Visit (INDEPENDENT_AMBULATORY_CARE_PROVIDER_SITE_OTHER): Payer: Medicaid Other

## 2022-03-08 ENCOUNTER — Ambulatory Visit (INDEPENDENT_AMBULATORY_CARE_PROVIDER_SITE_OTHER): Payer: Medicaid Other | Admitting: Advanced Practice Midwife

## 2022-03-08 ENCOUNTER — Other Ambulatory Visit: Payer: Self-pay

## 2022-03-08 ENCOUNTER — Encounter: Payer: Self-pay | Admitting: Advanced Practice Midwife

## 2022-03-08 VITALS — BP 97/66 | HR 72 | Wt 178.0 lb

## 2022-03-08 DIAGNOSIS — Z363 Encounter for antenatal screening for malformations: Secondary | ICD-10-CM

## 2022-03-08 DIAGNOSIS — Z3480 Encounter for supervision of other normal pregnancy, unspecified trimester: Secondary | ICD-10-CM

## 2022-03-08 DIAGNOSIS — O26899 Other specified pregnancy related conditions, unspecified trimester: Secondary | ICD-10-CM

## 2022-03-08 DIAGNOSIS — Z3A20 20 weeks gestation of pregnancy: Secondary | ICD-10-CM

## 2022-03-08 NOTE — Progress Notes (Signed)
? ?  LOW-RISK PREGNANCY VISIT ?Patient name: Tonya Parks MRN [id number]  Date of birth: 1996-02-02 ?Chief Complaint:   ?Routine Prenatal Visit ([place] today) ? ?History of Present Illness:   ?Tonya Parks is a 26 y.o. G74P1011 female at [redacted]w[redacted]d with an Estimated Date of Delivery: 07/23/22 being seen today for ongoing management of a low-risk pregnancy.  ?Today she reports no complaints. Contractions: Not present. Vag. Bleeding: None.  Movement: Present. denies leaking of fluid. ?Review of Systems:   ?Pertinent items are noted in HPI ?Denies abnormal vaginal discharge w/ itching/odor/irritation, headaches, visual changes, shortness of breath, chest pain, abdominal pain, severe nausea/vomiting, or problems with urination or bowel movements unless otherwise stated above. ?Pertinent History Reviewed:  ?Reviewed past medical,surgical, social, obstetrical and family history.  ?Reviewed problem list, medications and allergies. ?Physical Assessment:  ? ?Vitals:  ? 03/08/22 1042  ?BP: 97/66  ?Pulse: 72  ?Weight: 178 lb (80.7 kg)  ?Body mass index is 27.88 kg/m?. ?  ?     Physical Examination:  ? General appearance: Well appearing, and in no distress ? Mental status: Alert, oriented to person, place, and time ? Skin: Warm & dry ? Cardiovascular: Normal heart rate noted ? Respiratory: Normal respiratory effort, no distress ? Abdomen: Soft, gravid, nontender ? Pelvic: Cervical exam deferred        ? Extremities: Edema: None ? ?Fetal Status: Fetal Heart Rate (bpm): [place]   Movement: Present  [place] 20+3 wks,cephalic,anterior placenta gr 0,normal ovaries,FHR 140 BPM,CX 2.8 cm,SVP of fluid 5.6 cm,EFW 84%,anatomy complete,no obvious abnormalities  ? ?Chaperone: n/a   ? ?No results found for this or any previous visit (from the past 24 hour(s)).  ?Assessment & Plan:  ?1) Low-risk pregnancy G3P1011 at [redacted]w[redacted]d with an Estimated Date of Delivery: 07/23/22  ? ? ?  ?Meds: No orders of the defined types were placed in this encounter. ? ?Labs/procedures today:   ? ?Plan:  Continue routine obstetrical care  ?Next visit: prefers in person   ? ?Reviewed: Preterm labor symptoms and general obstetric precautions including but not limited to vaginal bleeding, contractions, leaking of fluid and fetal movement were reviewed in detail with the patient.  All questions were answered. Has home bp cuff.. Check bp weekly, let us know if >140/90. as ? ?Follow-up: Return in about 4 weeks (around 04/05/2022) for LROB. ? ?No orders of the defined types were placed in this encounter. ? ?Jacklyn Shell DNP, CNM ?03/08/2022 ?11:10 AM ? ?

## 2022-03-08 NOTE — Patient Instructions (Signed)
Pecola Lawless, I greatly value your feedback.  If you receive a survey following your visit with [place] today, we appreciate you taking the time to fill it out.  ?Thanks, ?[name], CNM ? ? ? ? ?[facility] HAS MOVED!!! ?It is now [facility] ?([address] [place], [place] [address]) ?Entrance located off of [address] ?Free 24/7 valet parking  ? ?Go to [email or web address] to register for FREE online childbirth classes  ? ? ?Second Trimester of Pregnancy ?The second trimester is from week 14 through week 27 (months 4 through 6). The second trimester is often a time when you feel your best. Your body has adjusted to being pregnant, and you begin to feel better physically. Usually, morning sickness has lessened or quit completely, you may have more energy, and you may have an increase in appetite. The second trimester is also a time when the fetus is growing rapidly. At the end of the sixth month, the fetus is about 9 inches long and weighs about 1? pounds. You will likely begin to feel the baby move (quickening) between 16 and 20 weeks of pregnancy. ?Body changes during your second trimester ?Your body continues to go through many changes during your second trimester. The changes vary from woman to woman. ?Your weight will continue to increase. You will notice your lower abdomen bulging out. ?You may begin to get stretch marks on your hips, abdomen, and breasts. ?You may develop headaches that can be relieved by medicines. The medicines should be approved by your health care provider. ?You may urinate more often because the fetus is pressing on your bladder. ?You may develop or continue to have heartburn as a result of your pregnancy. ?You may develop constipation because certain hormones are causing the muscles that push waste through your intestines to slow down. ?You may develop hemorrhoids or swollen, bulging veins (varicose veins). ?You may have back pain. This is  caused by: ?Weight gain. ?Pregnancy hormones that are relaxing the joints in your pelvis. ?A shift in weight and the muscles that support your balance. ?Your breasts will continue to grow and they will continue to become tender. ?Your gums may bleed and may be sensitive to brushing and flossing. ?Dark spots or blotches (chloasma, mask of pregnancy) may develop on your face. This will likely fade after the baby is born. ?A dark line from your belly button to the pubic area (linea nigra) may appear. This will likely fade after the baby is born. ?You may have changes in your hair. These can include thickening of your hair, rapid growth, and changes in texture. Some women also have hair loss during or after pregnancy, or hair that feels dry or thin. Your hair will most likely return to normal after your baby is born. ? ?What to expect at prenatal visits ?During a routine prenatal visit: ?You will be weighed to make sure you and the fetus are growing normally. ?Your blood pressure will be taken. ?Your abdomen will be measured to track your baby's growth. ?The fetal heartbeat will be listened to. ?Any test results from the previous visit will be discussed. ? ?Your health care provider may ask you: ?How you are feeling. ?If you are feeling the baby move. ?If you have had any abnormal symptoms, such as leaking fluid, bleeding, severe headaches, or abdominal cramping. ?If you are using any tobacco products, including cigarettes, chewing tobacco, and electronic cigarettes. ?If you have any questions. ? ?Other tests that  may be performed during your second trimester include: ?Blood tests that check for: ?Low iron levels (anemia). ?High blood sugar that affects pregnant women (gestational diabetes) between 38 and 28 weeks. ?Rh antibodies. This is to check for a protein on red blood cells (Rh factor). ?Urine tests to check for infections, diabetes, or protein in the urine. ?An ultrasound to confirm the proper growth and  development of the baby. ?An amniocentesis to check for possible genetic problems. ?Fetal screens for spina bifida and Down syndrome. ?HIV (human immunodeficiency virus) testing. Routine prenatal testing includes screening for HIV, unless you choose not to have this test. ? ?Follow these instructions at home: ?Medicines ?Follow your health care provider's instructions regarding medicine use. Specific medicines may be either safe or unsafe to take during pregnancy. ?Take a prenatal vitamin that contains at least 600 micrograms (mcg) of folic acid. ?If you develop constipation, try taking a stool softener if your health care provider approves. ?Eating and drinking ?Eat a balanced diet that includes fresh fruits and vegetables, whole grains, good sources of protein such as meat, eggs, or tofu, and low-fat dairy. Your health care provider will help you determine the amount of weight gain that is right for you. ?Avoid raw meat and uncooked cheese. These carry germs that can cause birth defects in the baby. ?If you have low calcium intake from food, talk to your health care provider about whether you should take a daily calcium supplement. ?Limit foods that are high in fat and processed sugars, such as fried and sweet foods. ?To prevent constipation: ?Drink enough fluid to keep your urine clear or pale yellow. ?Eat foods that are high in fiber, such as fresh fruits and vegetables, whole grains, and beans. ?Activity ?Exercise only as directed by your health care provider. Most women can continue their usual exercise routine during pregnancy. Try to exercise for 30 minutes at least 5 days a week. Stop exercising if you experience uterine contractions. ?Avoid heavy lifting, wear low heel shoes, and practice good posture. ?A sexual relationship may be continued unless your health care provider directs you otherwise. ?Relieving pain and discomfort ?Wear a good support bra to prevent discomfort from breast tenderness. ?Take  warm sitz baths to soothe any pain or discomfort caused by hemorrhoids. Use hemorrhoid cream if your health care provider approves. ?Rest with your legs elevated if you have leg cramps or low back pain. ?If you develop varicose veins, wear support hose. Elevate your feet for 15 minutes, 3-4 times a day. Limit salt in your diet. ?Prenatal Care ?Write down your questions. Take them to your prenatal visits. ?Keep all your prenatal visits as told by your health care provider. This is important. ?Safety ?Wear your seat belt at all times when driving. ?Make a list of emergency phone numbers, including numbers for family, friends, the hospital, and police and fire departments. ?General instructions ?Ask your health care provider for a referral to a local prenatal education class. Begin classes no later than the beginning of month 6 of your pregnancy. ?Ask for help if you have counseling or nutritional needs during pregnancy. Your health care provider can offer advice or refer you to specialists for help with various needs. ?Do not use hot tubs, steam rooms, or saunas. ?Do not douche or use tampons or scented sanitary pads. ?Do not cross your legs for long periods of time. ?Avoid cat litter boxes and soil used by cats. These carry germs that can cause birth defects in the baby and  possibly loss of the fetus by miscarriage or stillbirth. ?Avoid all smoking, herbs, alcohol, and unprescribed drugs. Chemicals in these products can affect the formation and growth of the baby. ?Do not use any products that contain nicotine or tobacco, such as cigarettes and e-cigarettes. If you need help quitting, ask your health care provider. ?Visit your dentist if you have not gone yet during your pregnancy. Use a soft toothbrush to brush your teeth and be gentle when you floss. ?Contact a health care provider if: ?You have dizziness. ?You have mild pelvic cramps, pelvic pressure, or nagging pain in the abdominal area. ?You have persistent  nausea, vomiting, or diarrhea. ?You have a bad smelling vaginal discharge. ?You have pain when you urinate. ?Get help right away if: ?You have a fever. ?You are leaking fluid from your vagina. ?You have spotting or bl

## 2022-03-08 NOTE — Progress Notes (Signed)
[place] 20+3 wks,cephalic,anterior placenta gr 0,normal ovaries,FHR 140 BPM,CX 2.8 cm,SVP of fluid 5.6 cm,EFW 84%,anatomy complete,no obvious abnormalities  ?

## 2022-03-17 ENCOUNTER — Emergency Department (HOSPITAL_COMMUNITY)
Admission: EM | Admit: 2022-03-17 | Discharge: 2022-03-17 | Disposition: A | Discharge status: Home / Self Care | Payer: Medicaid Other | Attending: Emergency Medicine | Admitting: Emergency Medicine

## 2022-03-17 ENCOUNTER — Other Ambulatory Visit: Payer: Self-pay

## 2022-03-17 ENCOUNTER — Encounter (HOSPITAL_COMMUNITY): Payer: Self-pay | Admitting: *Deleted

## 2022-03-17 DIAGNOSIS — Z3A21 21 weeks gestation of pregnancy: Secondary | ICD-10-CM | POA: Diagnosis not present

## 2022-03-17 DIAGNOSIS — W010XXA Fall on same level from slipping, tripping and stumbling without subsequent striking against object, initial encounter: Secondary | ICD-10-CM | POA: Diagnosis not present

## 2022-03-17 DIAGNOSIS — R000 Tachycardia, unspecified: Secondary | ICD-10-CM | POA: Insufficient documentation

## 2022-03-17 DIAGNOSIS — O9A212 Injury, poisoning and certain other consequences of external causes complicating pregnancy, second trimester: Secondary | ICD-10-CM | POA: Diagnosis not present

## 2022-03-17 DIAGNOSIS — O26892 Other specified pregnancy related conditions, second trimester: Secondary | ICD-10-CM | POA: Diagnosis present

## 2022-03-17 DIAGNOSIS — W19XXXA Unspecified fall, initial encounter: Secondary | ICD-10-CM

## 2022-03-17 NOTE — ED Notes (Addendum)
Tonya Parks with [facility] contacted. Information given. Pt seen for OB at [facility] in [place]. No complications noted with pregnancy. This is pt's second pregnancy. She has 1 child at this time, no complications with first pregnancy either. [facility] states [facility] but does not require FHR or toco monitoring at this time. She will call RN back if any changes to recommendations suggested.  ?

## 2022-03-17 NOTE — ED Notes (Addendum)
FHR 141 confirmed with external doppler. Pt states she continues to feel baby move. Instructed to begin kick count and notify if less movement noted. [facility] recommendations. Pt denies feeling any contractions. Pt denies any injuries aside from soreness to coccyx  ?

## 2022-03-17 NOTE — Discharge Instructions (Signed)
Follow-up with your [facility] gynecologist as planned.  Return to the ED if you start having vaginal bleeding or abdominal pain.  ?

## 2022-03-17 NOTE — ED Triage Notes (Signed)
Pt slipped in the mud this morning, landing on her bottom.  Pt is [redacted] weeks pregnant. Bottom is sore at times since.   ?

## 2022-03-17 NOTE — ED Provider Notes (Signed)
?[facility] ?Provider Note ? ? ?CSN: [id number] ?Arrival date & time: 03/17/22  1353 ? ?  ? ?History ? ?Chief Complaint  ?Patient presents with  ? Fall  ? ? ?Tonya Parks is a 26 y.o. female. ? ? ?Fall ? ? ?Patient is a 26 year old female [redacted] weeks gestational age presenting today due to fall last night.  Patient states she was walking outside, she tripped and fell backwards landing on her bottom.  She did not hit her head or lose consciousness.  Pain has been intermittent since then, its worse never she ambulates.  She is not having any urinary incontinence, bilateral lower extremity weakness or numbness, saddle anesthesia.  Has not tried anything for the pain. ? ?She is hearing the baby moving.  Denies any vaginal bleeding or abdominal pain. ? ?Home Medications ?Prior to Admission medications   ?Medication Sig Start Date End Date Taking? Authorizing Provider  ?Blood Pressure Monitor MISC For regular home bp monitoring during pregnancy 01/18/22   Roma Schanz, CNM  ?cyclobenzaprine (FLEXERIL) 5 MG tablet Take 1 tablet (5 mg total) by mouth 3 (three) times daily as needed for muscle spasms. ?Patient not taking: Reported on 03/08/2022 02/28/22   Gabriel Carina, CNM  ?Magnesium Oxide (MAG-OXIDE) 200 MG TABS Take 2 tablets (400 mg total) by mouth at bedtime. If that amount causes loose stools in the am, switch to 200mg  daily at bedtime. ?Patient not taking: Reported on 03/08/2022 02/28/22   Gabriel Carina, CNM  ?Prenatal Vit-Fe Fumarate-FA (PRENATAL VITAMIN PO) Take by mouth.    [provider]  ?promethazine (PHENERGAN) 25 MG tablet Take 0.5-1 tablets (12.5-25 mg total) by mouth every 6 (six) hours as needed for nausea or vomiting. ?Patient not taking: Reported on 03/08/2022 01/18/22   Roma Schanz, CNM  ?   ? ?Allergies    ?Patient has no known allergies.   ? ?Review of Systems   ?Review of Systems ? ?Physical Exam ?Updated Vital Signs ?BP (!) 111/58 (BP Location: Right Arm)    Pulse 81   Temp 97.8 ?F (36.6 ?C) (Oral)   Resp (!) 21   Ht 5\' 7"  (1.702 m)   Wt 80.7 kg   LMP 10/16/2021 (Approximate)   SpO2 98%   BMI 27.88 kg/m?  ?Physical Exam ?Vitals and nursing note reviewed. Exam conducted with a chaperone present.  ?Constitutional:   ?   Appearance: Normal appearance.  ?HENT:  ?   Head: Normocephalic and atraumatic.  ?Eyes:  ?   General: No scleral icterus.    ?   Right eye: No discharge.     ?   Left eye: No discharge.  ?   Extraocular Movements: Extraocular movements intact.  ?   Pupils: Pupils are equal, round, and reactive to light.  ?Cardiovascular:  ?   Rate and Rhythm: Regular rhythm. Tachycardia present.  ?   Pulses: Normal pulses.  ?   Heart sounds: Normal heart sounds. No murmur heard. ?  No friction rub. No gallop.  ?Pulmonary:  ?   Effort: Pulmonary effort is normal. No respiratory distress.  ?   Breath sounds: Normal breath sounds.  ?Abdominal:  ?   General: Abdomen is flat. Bowel sounds are normal. There is no distension.  ?   Palpations: Abdomen is soft.  ?   Tenderness: There is no abdominal tenderness.  ?Skin: ?   General: Skin is warm and dry.  ?   Coloration: Skin is not jaundiced.  ?Neurological:  ?  Mental Status: She is alert. Mental status is at baseline.  ?   Coordination: Coordination normal.  ?   Comments: Cranial nerves III through XII are grossly intact.  Grips and equal bilaterally, able to raise both lower extremities without any difficulty.  ? ? ?ED Results / Procedures / Treatments   ?Labs ?(all labs ordered are listed, but only abnormal results are displayed) ?Labs Reviewed - No data to display ? ?EKG ?None ? ?Radiology ?No results found. ? ?Procedures ?Procedures  ? ? ?Medications Ordered in ED ?Medications - No data to display ? ?ED Course/ Medical Decision Making/ A&P ?  ?                        ?Medical Decision Making ? ?This patient presents to the ED for concern of fall, this involves an extensive number of treatment options, and is a  complaint that carries with it a high risk of complications and morbidity.  The differential diagnosis includes but not limited to: traumatic miscarriage, compression fracture, muscle sprain or bruise. ? ?Physical Exam: ? ?Reassuringly, fetal heart rate 141.  Abdomen is soft nontender.  Cranial nerves II through XII are grossly intact and patient is able to move upper and lower extremities without any difficulty.  No point tenderness to the lumbar spine. ? ?Patient was tachycardic when vitals were initially taken with a heart rate of 130.  On my exam patient's heart rate is in the upper 90s, her rhythm is regular there is no murmur. ? ?Additional history obtained:  ? ?Independent historian: Nursing staff, they called rapid response OB.  They did not recommend any fetal heart monitoring or toco monitoring at this time. ? ?ECG/Cardiac monitoring:  ? ?The patient was maintained on a cardiac monitor.  Visualized monitor strip which showed sinus rhythm with a heart rate of 98 bpm per my interpretation.  ? ? ?Medicines ordered and prescription drug management: ? ?I have reviewed the patients home medicines and have made adjustments as needed ? ? ?Test Considered: ? ?Considered imaging and laboratory work-up.  However, based on physical exam this appears to be straightforward muscle sprain.  Given she is pregnant, I do not think the risk of radiation for imaging is worth it given benign physical exam without any focal tenderness or neurodeficits.  Additionally, patient does not have any abdominal pain, vaginal bleeding and I do not have a high suspicion for any traumatic complication to her pregnancy. ? ?  ?Social Determinants of Health: ?Has outpatient OBG follow up. ?  ? ?Disposition: ? ?After consideration of the diagnostic results and the patients response to treatment, I feel that the patent would benefit from close outpatient follow-up with OBGYN. ? ? ? ? ? ? ? ? ? ?Final Clinical Impression(s) / ED Diagnoses ?Final  diagnoses:  ?Fall, initial encounter  ? ? ?Rx / DC Orders ?ED Discharge Orders   ? ? None  ? ?  ? ? ?  ?Sherrill Raring, PA-C ?03/17/22 1540 ? ?  ?Wyvonnia Dusky, MD ?03/17/22 1609 ? ?

## 2022-04-05 ENCOUNTER — Ambulatory Visit (INDEPENDENT_AMBULATORY_CARE_PROVIDER_SITE_OTHER): Payer: Medicaid Other | Admitting: Advanced Practice Midwife

## 2022-04-05 VITALS — BP 124/74 | HR 110 | Wt 180.0 lb

## 2022-04-05 DIAGNOSIS — Z3482 Encounter for supervision of other normal pregnancy, second trimester: Secondary | ICD-10-CM

## 2022-04-05 DIAGNOSIS — Z3A24 24 weeks gestation of pregnancy: Secondary | ICD-10-CM

## 2022-04-05 NOTE — Patient Instructions (Signed)

## 2022-04-05 NOTE — Progress Notes (Signed)
? ?  LOW-RISK PREGNANCY VISIT ?Patient name: Tonya Parks MRN [id number]  Date of birth: [birth year] ?Chief Complaint:   ?Routine Prenatal Visit ? ?History of Present Illness:   ?Tonya Parks is a 25 y.o. G67P1011 female at [redacted]w[redacted]d with an Estimated Date of Delivery: 07/23/22 being seen today for ongoing management of a low-risk pregnancy.  ?Today she reports no complaints. Contractions: Not present. Vag. Bleeding: None.  Movement: Present. denies leaking of fluid. ?Review of Systems:   ?Pertinent items are noted in HPI ?Denies abnormal vaginal discharge w/ itching/odor/irritation, headaches, visual changes, shortness of breath, chest pain, abdominal pain, severe nausea/vomiting, or problems with urination or bowel movements unless otherwise stated above. ?Pertinent History Reviewed:  ?Reviewed past medical,surgical, social, obstetrical and family history.  ?Reviewed problem list, medications and allergies. ?Physical Assessment:  ? ?Vitals:  ? 04/05/22 1001  ?BP: 124/74  ?Pulse: (!) 110  ?Weight: 180 lb (81.6 kg)  ?Body mass index is 28.19 kg/m?. ?  ?     Physical Examination:  ? General appearance: Well appearing, and in no distress ? Mental status: Alert, oriented to person, place, and time ? Skin: Warm & dry ? Cardiovascular: Normal heart rate noted ? Respiratory: Normal respiratory effort, no distress ? Abdomen: Soft, gravid, nontender ? Pelvic: Cervical exam deferred        ? Extremities: Edema: None ? ?Fetal Status: Fetal Heart Rate (bpm): 141 Fundal Height: 23 cm Movement: Present   ? ?Chaperone: n/a   ? ?No results found for this or any previous visit (from the past 24 hour(s)).  ?Assessment & Plan:  ?1) Low-risk pregnancy G3P1011 at [redacted]w[redacted]d with an Estimated Date of Delivery: 07/23/22  ? ? ?  ?Meds: No orders of the defined types were placed in this encounter. ? ?Labs/procedures today: none ? ?Plan:  Continue routine obstetrical care  ?Next visit: prefers will be in person for GTT    ? ?Reviewed: Preterm labor symptoms  and general obstetric precautions including but not limited to vaginal bleeding, contractions, leaking of fluid and fetal movement were reviewed in detail with the patient.  All questions were answered. Has home bp cuff. . Check bp weekly, let us know if >140/90.  ? ?Follow-up: Return in about 3 weeks (around 04/26/2022) for PN2/LROB. ? ?No orders of the defined types were placed in this encounter. ? ?Jacklyn Shell DNP, CNM ?04/05/2022 ?9:29 PM ? ?

## 2022-04-13 ENCOUNTER — Ambulatory Visit: Payer: Self-pay | Admitting: Family Medicine

## 2022-05-03 ENCOUNTER — Encounter: Payer: Self-pay | Admitting: Advanced Practice Midwife

## 2022-05-03 ENCOUNTER — Ambulatory Visit (INDEPENDENT_AMBULATORY_CARE_PROVIDER_SITE_OTHER): Payer: Medicaid Other | Admitting: Advanced Practice Midwife

## 2022-05-03 ENCOUNTER — Other Ambulatory Visit (HOSPITAL_COMMUNITY)
Admission: RE | Admit: 2022-05-03 | Discharge: 2022-05-03 | Disposition: A | Discharge status: Home / Self Care | Payer: Medicaid Other | Source: Ambulatory Visit | Attending: Advanced Practice Midwife | Admitting: Advanced Practice Midwife

## 2022-05-03 VITALS — BP 103/55 | HR 73 | Wt 180.0 lb

## 2022-05-03 DIAGNOSIS — Z3480 Encounter for supervision of other normal pregnancy, unspecified trimester: Secondary | ICD-10-CM

## 2022-05-03 DIAGNOSIS — Z124 Encounter for screening for malignant neoplasm of cervix: Secondary | ICD-10-CM | POA: Insufficient documentation

## 2022-05-03 DIAGNOSIS — Z3A28 28 weeks gestation of pregnancy: Secondary | ICD-10-CM

## 2022-05-03 NOTE — Patient Instructions (Signed)

## 2022-05-03 NOTE — Progress Notes (Signed)
   LOW-RISK PREGNANCY VISIT Patient name: Tonya Parks MRN [id number]  Date of birth: 1996/12/04 Chief Complaint:   Routine Prenatal Visit (PN2)  History of Present Illness:   Tonya Parks is a 26 y.o. G30P1011 female at [redacted]w[redacted]d with an Estimated Date of Delivery: 07/23/22 being seen today for ongoing management of a low-risk pregnancy.  Today she reports lower back pain.  Maternity belt advised. Contractions: Not present. Vag. Bleeding: None.  Movement: Present. denies leaking of fluid. Review of Systems:   Pertinent items are noted in HPI Denies abnormal vaginal discharge w/ itching/odor/irritation, headaches, visual changes, shortness of breath, chest pain, abdominal pain, severe nausea/vomiting, or problems with urination or bowel movements unless otherwise stated above. Pertinent History Reviewed:  Reviewed past medical,surgical, social, obstetrical and family history.  Reviewed problem list, medications and allergies. Physical Assessment:   Vitals:   05/03/22 0936  BP: (!) 103/55  Pulse: 73  Weight: 180 lb (81.6 kg)  Body mass index is 28.19 kg/m.        Physical Examination:   General appearance: Well appearing, and in no distress  Mental status: Alert, oriented to person, place, and time  Skin: Warm & dry  Cardiovascular: Normal heart rate noted  Respiratory: Normal respiratory effort, no distress  Abdomen: Soft, gravid, nontender  Pelvic: Cervical exam deferred         Extremities: Edema: None  Fetal Status: Fetal Heart Rate (bpm): 139 Fundal Height: 27 cm Movement: Present    Chaperone: n/a    No results found for this or any previous visit (from the past 24 hour(s)).  Assessment & Plan:  1) Low-risk pregnancy G3P1011 at [redacted]w[redacted]d with an Estimated Date of Delivery: 07/23/22    Meds: No orders of the defined types were placed in this encounter.  Labs/procedures today: pap (wasn't scheduled for PN2)  Plan:  Continue routine obstetrical care  Next visit: prefers will be in  person for GTT     Reviewed: Preterm labor symptoms and general obstetric precautions including but not limited to vaginal bleeding, contractions, leaking of fluid and fetal movement were reviewed in detail with the patient.  All questions were answered. Has home bp cuff. Check bp weekly, let us know if >140/90.   Follow-up: Return for asap for GTT only; 3 weeks for LROB.  No orders of the defined types were placed in this encounter.  Jacklyn Shell DNP, CNM 05/03/2022 10:35 AM

## 2022-05-04 ENCOUNTER — Other Ambulatory Visit: Payer: Medicaid Other

## 2022-05-04 DIAGNOSIS — Z3483 Encounter for supervision of other normal pregnancy, third trimester: Secondary | ICD-10-CM

## 2022-05-04 DIAGNOSIS — Z3A28 28 weeks gestation of pregnancy: Secondary | ICD-10-CM

## 2022-05-04 DIAGNOSIS — Z131 Encounter for screening for diabetes mellitus: Secondary | ICD-10-CM

## 2022-05-04 LAB — CYTOLOGY - PAP: Diagnosis: NEGATIVE

## 2022-05-05 LAB — CBC
Hematocrit: 34.4 % (ref 34.0–46.6)
Hemoglobin: 11.8 g/dL (ref 11.1–15.9)
MCH: 31.5 pg (ref 26.6–33.0)
MCHC: 34.3 g/dL (ref 31.5–35.7)
MCV: 92 fL (ref 79–97)
Platelets: 239 10*3/uL (ref 150–450)
RBC: 3.75 x10E6/uL — ABNORMAL LOW (ref 3.77–5.28)
RDW: 12.4 % (ref 11.7–15.4)
WBC: 15.3 10*3/uL — ABNORMAL HIGH (ref 3.4–10.8)

## 2022-05-05 LAB — GLUCOSE TOLERANCE, 2 HOURS W/ 1HR
Glucose, 1 hour: 100 mg/dL (ref 70–179)
Glucose, 2 hour: 77 mg/dL (ref 70–152)
Glucose, Fasting: 84 mg/dL (ref 70–91)

## 2022-05-05 LAB — ANTIBODY SCREEN: Antibody Screen: NEGATIVE

## 2022-05-05 LAB — HIV ANTIBODY (ROUTINE TESTING W REFLEX): HIV Screen 4th Generation wRfx: NONREACTIVE

## 2022-05-05 LAB — RPR: RPR Ser Ql: NONREACTIVE

## 2022-05-24 ENCOUNTER — Encounter: Payer: Self-pay | Admitting: Advanced Practice Midwife

## 2022-05-24 ENCOUNTER — Ambulatory Visit (INDEPENDENT_AMBULATORY_CARE_PROVIDER_SITE_OTHER): Payer: Medicaid Other | Admitting: Advanced Practice Midwife

## 2022-05-24 VITALS — BP 118/80 | HR 102 | Wt 182.0 lb

## 2022-05-24 DIAGNOSIS — Z6791 Unspecified blood type, Rh negative: Secondary | ICD-10-CM

## 2022-05-24 DIAGNOSIS — Z3480 Encounter for supervision of other normal pregnancy, unspecified trimester: Secondary | ICD-10-CM | POA: Diagnosis not present

## 2022-05-24 DIAGNOSIS — Z3A31 31 weeks gestation of pregnancy: Secondary | ICD-10-CM | POA: Diagnosis not present

## 2022-05-24 DIAGNOSIS — O26899 Other specified pregnancy related conditions, unspecified trimester: Secondary | ICD-10-CM | POA: Diagnosis not present

## 2022-05-24 NOTE — Progress Notes (Signed)
   LOW-RISK PREGNANCY VISIT Patient name: Tonya Parks MRN [id number]  Date of birth: October 23, 1996 Chief Complaint:   Routine Prenatal Visit  History of Present Illness:   Tonya Parks is a 26 y.o. G54P1011 female at [redacted]w[redacted]d with an Estimated Date of Delivery: 07/23/22 being seen today for ongoing management of a low-risk pregnancy.  Today she reports no complaints. Contractions: Not present. Vag. Bleeding: None.  Movement: Present. denies leaking of fluid. Review of Systems:   Pertinent items are noted in HPI Denies abnormal vaginal discharge w/ itching/odor/irritation, headaches, visual changes, shortness of breath, chest pain, abdominal pain, severe nausea/vomiting, or problems with urination or bowel movements unless otherwise stated above. Pertinent History Reviewed:  Reviewed past medical,surgical, social, obstetrical and family history.  Reviewed problem list, medications and allergies. Physical Assessment:   Vitals:   05/24/22 0915  BP: 118/80  Pulse: (!) 102  Weight: 182 lb (82.6 kg)  Body mass index is 28.51 kg/m.        Physical Examination:   General appearance: Well appearing, and in no distress  Mental status: Alert, oriented to person, place, and time  Skin: Warm & dry  Cardiovascular: Normal heart rate noted  Respiratory: Normal respiratory effort, no distress  Abdomen: Soft, gravid, nontender  Pelvic: Cervical exam deferred         Extremities: Edema: None  Fetal Status: Fetal Heart Rate (bpm): 140 Fundal Height: 30 cm Movement: Present    Chaperone: n/a    No results found for this or any previous visit (from the past 24 hour(s)).  Assessment & Plan:  1) Low-risk pregnancy G3P1011 at [redacted]w[redacted]d with an Estimated Date of Delivery: 07/23/22     Meds: No orders of the defined types were placed in this encounter.  Labs/procedures today: rhogam  Plan:  Continue routine obstetrical care     Next visit: prefers in person    Reviewed: Preterm labor symptoms and general  obstetric precautions including but not limited to vaginal bleeding, contractions, leaking of fluid and fetal movement were reviewed in detail with the patient.  All questions were answered. Has home bp cuff.. Check bp weekly, let us know if >140/90.   Follow-up: Return in about 2 weeks (around 06/07/2022) for [place].  Orders Placed This Encounter  Procedures   RHO (D) Immune Globulin   Christin Fudge DNP, CNM 05/24/2022 9:39 AM

## 2022-05-24 NOTE — Patient Instructions (Signed)
Tonya Parks, I greatly value your feedback.  If you receive a survey following your visit with [place] today, we appreciate you taking the time to fill it out.  Thanks, Tonya Beams, DNP, CNM  [facility] HAS MOVED!!! It is now [facility] at [facility] ([address] [place], [place] [address]) Entrance located off of [address] Free 24/7 valet parking   Go to [email or web address] to register for FREE online childbirth classes    Call the office [phone]) or go to [facility] if: You begin to have strong, frequent contractions Your water breaks.  Sometimes it is a big gush of fluid, sometimes it is just a trickle that keeps getting your panties wet or running down your legs You have vaginal bleeding.  It is normal to have a small amount of spotting if your cervix was checked.  You don't feel your baby moving like normal.  If you don't, get you something to eat and drink and lay down and focus on feeling your baby move.  You should feel at least 10 movements in 2 hours.  If you don't, you should call the office or go to [facility].   Home Blood Pressure Monitoring for Patients   Your provider has recommended that you check your blood pressure (BP) at least once a week at home. If you do not have a blood pressure cuff at home, one will be provided for you. Contact your provider if you have not received your monitor within 1 week.   Helpful Tips for Accurate Home Blood Pressure Checks  Don't smoke, exercise, or drink caffeine 30 minutes before checking your BP Use the restroom before checking your BP (a full bladder can raise your pressure) Relax in a comfortable upright chair Feet on the ground Left arm resting comfortably on a flat surface at the level of your heart Legs uncrossed Back supported Sit quietly and don't talk Place the cuff on your bare arm Adjust snuggly, so that only two fingertips can fit between your skin and the top of  the cuff Check 2 readings separated by at least one minute Keep a log of your BP readings For a visual, please reference this diagram: http://ccnc.care/bpdiagram  Provider Name: [facility]     Phone: [phone]  Zone 1: ALL CLEAR  Continue to monitor your symptoms:  BP reading is less than 140 (top number) or less than 90 (bottom number)  No right upper stomach pain No headaches or seeing spots No feeling nauseated or throwing up No swelling in face and hands  Zone 2: CAUTION Call your doctor's office for any of the following:  BP reading is greater than 140 (top number) or greater than 90 (bottom number)  Stomach pain under your ribs in the middle or right side Headaches or seeing spots Feeling nauseated or throwing up Swelling in face and hands  Zone 3: EMERGENCY  Seek immediate medical care if you have any of the following:  BP reading is greater than160 (top number) or greater than 110 (bottom number) Severe headaches not improving with Tylenol Serious difficulty catching your breath Any worsening symptoms from Zone 2

## 2022-06-11 ENCOUNTER — Ambulatory Visit (INDEPENDENT_AMBULATORY_CARE_PROVIDER_SITE_OTHER): Payer: Medicaid Other | Admitting: Obstetrics & Gynecology

## 2022-06-11 ENCOUNTER — Encounter: Payer: Medicaid Other | Admitting: Obstetrics & Gynecology

## 2022-06-11 ENCOUNTER — Encounter: Payer: Self-pay | Admitting: Obstetrics & Gynecology

## 2022-06-11 VITALS — BP 108/72 | HR 98 | Wt 182.0 lb

## 2022-06-11 DIAGNOSIS — Z3480 Encounter for supervision of other normal pregnancy, unspecified trimester: Secondary | ICD-10-CM

## 2022-06-20 ENCOUNTER — Encounter: Payer: Self-pay | Admitting: Obstetrics & Gynecology

## 2022-06-26 ENCOUNTER — Encounter: Payer: Self-pay | Admitting: Women's Health

## 2022-06-26 ENCOUNTER — Ambulatory Visit (INDEPENDENT_AMBULATORY_CARE_PROVIDER_SITE_OTHER): Payer: Medicaid Other | Admitting: Women's Health

## 2022-06-26 ENCOUNTER — Other Ambulatory Visit (HOSPITAL_COMMUNITY)
Admission: RE | Admit: 2022-06-26 | Discharge: 2022-06-26 | Disposition: A | Discharge status: Home / Self Care | Payer: Medicaid Other | Source: Ambulatory Visit | Attending: Women's Health | Admitting: Women's Health

## 2022-06-26 VITALS — BP 115/70 | HR 101 | Wt 183.0 lb

## 2022-06-26 DIAGNOSIS — Z3480 Encounter for supervision of other normal pregnancy, unspecified trimester: Secondary | ICD-10-CM

## 2022-06-26 DIAGNOSIS — Z3483 Encounter for supervision of other normal pregnancy, third trimester: Secondary | ICD-10-CM

## 2022-06-26 DIAGNOSIS — Z3A36 36 weeks gestation of pregnancy: Secondary | ICD-10-CM | POA: Insufficient documentation

## 2022-06-26 DIAGNOSIS — O09293 Supervision of pregnancy with other poor reproductive or obstetric history, third trimester: Secondary | ICD-10-CM

## 2022-06-26 DIAGNOSIS — F5089 Other specified eating disorder: Secondary | ICD-10-CM

## 2022-06-26 LAB — POCT HEMOGLOBIN: Hemoglobin: 11.3 g/dL (ref 11–14.6)

## 2022-06-26 NOTE — Progress Notes (Signed)
LOW-RISK PREGNANCY VISIT Patient name: Tonya Parks MRN [id number]  Date of birth: 1996/09/25 Chief Complaint:   Routine Prenatal Visit (Craving ice/)  History of Present Illness:   Tonya Parks is a 26 y.o. G39P1011 female at [redacted]w[redacted]d with an Estimated Date of Delivery: 07/23/22 being seen today for ongoing management of a low-risk pregnancy.   Today she reports  craving ice, eats at work. No dizziness/lightheadedness. Wants to come out of work 2wks before [facility], discussed not medically necessary, would need to discuss w/ her job. . Thinks she has a hemorrhoid, doesn't bother her, has been using cream and TUCKS pads. Some constipation. Contractions: Irritability. Vag. Bleeding: None.  Movement: Present. denies leaking of fluid.     01/18/2022    1:43 PM 08/15/2021    9:45 AM  Depression screen PHQ 2/9  Decreased Interest 0 0  Down, Depressed, Hopeless 0 0  PHQ - 2 Score 0 0  Altered sleeping 0 0  Tired, decreased energy 1 0  Change in appetite 1 0  Feeling bad or failure about yourself  0 0  Trouble concentrating 0 0  Moving slowly or fidgety/restless 0 0  Suicidal thoughts 0 0  PHQ-9 Score 2 0        01/18/2022    1:43 PM 08/15/2021    9:47 AM  GAD 7 : Generalized Anxiety Score  Nervous, Anxious, on Edge 0 0  Control/stop worrying 0 0  Worry too much - different things 0 0  Trouble relaxing 0 0  Restless 0 0  Easily annoyed or irritable 0 0  Afraid - awful might happen 0 0  Total GAD 7 Score 0 0      Review of Systems:   Pertinent items are noted in HPI Denies abnormal vaginal discharge w/ itching/odor/irritation, headaches, visual changes, shortness of breath, chest pain, abdominal pain, severe nausea/vomiting, or problems with urination or bowel movements unless otherwise stated above. Pertinent History Reviewed:  Reviewed past medical,surgical, social, obstetrical and family history.  Reviewed problem list, medications and allergies. Physical Assessment:   Vitals:    06/26/22 1037  BP: 115/70  Pulse: (!) 101  Weight: 183 lb (83 kg)  Body mass index is 28.66 kg/m.        Physical Examination:   General appearance: Well appearing, and in no distress  Mental status: Alert, oriented to person, place, and time  Skin: Warm & dry  Cardiovascular: Normal heart rate noted  Respiratory: Normal respiratory effort, no distress  Abdomen: Soft, gravid, nontender  Pelvic: Cervical exam performed  Dilation: 1.5 Effacement (%): 80 Station: -2  Rectal: small non-thrombosed hemorrhoid  Extremities: Edema: None  Fetal Status: Fetal Heart Rate (bpm): 133 Fundal Height: 36 cm Movement: Present Presentation: Vertex  Chaperone: Jobe Marker   Results for orders placed or performed in visit on 06/26/22 (from the past 24 hour(s))  POCT hemoglobin   Collection Time: 06/26/22 10:45 AM  Result Value Ref Range   Hemoglobin 11.3 11 - 14.6 g/dL    Assessment & Plan:  1) Low-risk pregnancy G3P1011 at [redacted]w[redacted]d with an Estimated Date of Delivery: 07/23/22   2) Small non thrombosed hemorrhoid> gave printed info on constipation, continue otc meds  3) Pica> craving, eating ice- hgb 11.3, continue pnv, increase fe-rich foods  4) H/O macrosomic infant> will get efw in next few weeks   Meds: No orders of the defined types were placed in this encounter.  Labs/procedures today: GBS, GC/CT, and SVE  Plan:  Continue routine obstetrical care  Next visit: prefers in person    Reviewed: Preterm labor symptoms and general obstetric precautions including but not limited to vaginal bleeding, contractions, leaking of fluid and fetal movement were reviewed in detail with the patient.  All questions were answered. Does have home bp cuff. Office bp cuff given: not applicable. Check bp weekly, let us know if consistently >140 and/or >90.  Follow-up: Return for weekly, CNM, in person, LROB; EFW u/s w/in next 3wks .  No future appointments.  Orders Placed This Encounter  Procedures    Culture, beta strep (group b only)   US OB Follow Up   POCT hemoglobin   Cheral Marker CNM, [facility] 06/26/2022 11:20 AM

## 2022-06-26 NOTE — Patient Instructions (Addendum)
Tonya Parks, thank you for choosing our office today! We appreciate the opportunity to meet your healthcare needs. You may receive a short survey by mail, e-mail, or through [employer]. If you are happy with your care we would appreciate if you could take just a few minutes to complete the survey questions. We read all of your comments and take your feedback very seriously. Thank you again for choosing our office.  Center for [employer] Team at [facility]  [facility] at [facility] ([address] [place], [place] [address]) Entrance C, located off of [address] 24/7 valet parking   Constipation Drink plenty of fluid, preferably water, throughout the day Eat foods high in fiber such as fruits, vegetables, and grains Exercise, such as walking, is a good way to keep your bowels regular Drink warm fluids, especially warm prune juice, or decaf coffee Eat a 1/2 cup of real oatmeal (not instant), 1/2 cup applesauce, and 1/2-1 cup warm prune juice every day If needed, you may take Colace (docusate sodium) stool softener once or twice a day to help keep the stool soft. If you are pregnant, wait until you are out of your first trimester (12-14 weeks of pregnancy) If you still are having problems with constipation, you may take Miralax once daily as needed to help keep your bowels regular.  If you are pregnant, wait until you are out of your first trimester (12-14 weeks of pregnancy)    CLASSES: Go to [email or web address] to register for classes (childbirth, breastfeeding, waterbirth, infant CPR, daddy bootcamp, etc.)  Call the office [phone]) or go to [facility] if: You begin to have strong, frequent contractions Your water breaks.  Sometimes it is a big gush of fluid, sometimes it is just a trickle that keeps getting your panties wet or running down your legs You have vaginal bleeding.  It is normal to have a small amount of spotting if your cervix was checked.  You don't  feel your baby moving like normal.  If you don't, get you something to eat and drink and lay down and focus on feeling your baby move.   If your baby is still not moving like normal, you should call the office or go to [facility].  Call the office [phone]) or go to [facility] for these signs of pre-eclampsia: Severe headache that does not go away with Tylenol Visual changes- seeing spots, double, blurred vision Pain under your right breast or upper abdomen that does not go away with Tums or heartburn medicine Nausea and/or vomiting Severe swelling in your hands, feet, and face   Tdap Vaccine It is recommended that you get the Tdap vaccine during the third trimester of EACH pregnancy to help protect your baby from getting pertussis (whooping cough) 27-36 weeks is the BEST time to do this so that you can pass the protection on to your baby. During pregnancy is better than after pregnancy, but if you are unable to get it during pregnancy it will be offered at the hospital.  You can get this vaccine with [place], at the health department, your family doctor, or some local pharmacies Everyone who will be around your baby should also be up-to-date on their vaccines before the baby comes. Adults (who are not pregnant) only need 1 dose of Tdap during adulthood.   [facility] Pediatricians/Family Doctors [facility]): [address] Colette Ribas, [phone]           [facility]:  [address], [phone]                [facility] Medicine [facility]): [address], [phone] (call to ask if accepting patients) [facility] Department: [address], [place], [phone]    [facility] Pediatricians/Family Doctors [facility]): [phone] [address], [phone] [facility]: [address] [place], [phone] [facility]: [address], [phone]  [facility]  Doctors  [facility]): [phone] [facility]: [address], [phone]   [facility] Doctors [facility]: [address] [phone]  [facility] Psychiatric Doctors  [facility]: [phone], [phone]  Home Blood Pressure Monitoring for Patients   Your provider has recommended that you check your blood pressure (BP) at least once a week at home. If you do not have a blood pressure cuff at home, one will be provided for you. Contact your provider if you have not received your monitor within 1 week.   Helpful Tips for Accurate Home Blood Pressure Checks  Don't smoke, exercise, or drink caffeine 30 minutes before checking your BP Use the restroom before checking your BP (a full bladder can raise your pressure) Relax in a comfortable upright chair Feet on the ground Left arm resting comfortably on a flat surface at the level of your heart Legs uncrossed Back supported Sit quietly and don't talk Place the cuff on your bare arm Adjust snuggly, so that only two fingertips can fit between your skin and the top of the cuff Check 2 readings separated by at least one minute Keep a log of your BP readings For a visual, please reference this diagram: http://ccnc.care/bpdiagram  Provider Name: [facility]     Phone: [phone]  Zone 1: ALL CLEAR  Continue to monitor your symptoms:  BP reading is less than 140 (top number) or less than 90 (bottom number)  No right upper stomach pain No headaches or seeing spots No feeling nauseated or throwing up No swelling in face and hands  Zone 2: CAUTION Call your doctor's office for any of the following:  BP reading is greater than 140 (top number) or greater than 90 (bottom number)  Stomach pain under your ribs in the middle or right side Headaches or seeing spots Feeling nauseated or throwing up Swelling in face and hands  Zone 3: EMERGENCY   Seek immediate medical care if you have any of the following:  BP reading is greater than160 (top number) or greater than 110 (bottom number) Severe headaches not improving with Tylenol Serious difficulty catching your breath Any worsening symptoms from Zone 2  Preterm Labor and Birth Information  The normal length of a pregnancy is 39-41 weeks. Preterm labor is when labor starts before 37 completed weeks of pregnancy. What are the risk factors for preterm labor? Preterm labor is more likely to occur in women who: Have certain infections during pregnancy such as a bladder infection, sexually transmitted infection, or infection inside the uterus (chorioamnionitis). Have a shorter-than-normal cervix. Have gone into preterm labor before. Have had surgery on their cervix. Are younger than age 26 or older than age 42. Are African American. Are pregnant with twins or multiple babies (multiple gestation). Take street drugs or smoke while pregnant. Do not gain enough weight while pregnant. Became pregnant shortly after having been pregnant. What are the symptoms of preterm labor? Symptoms  of preterm labor include: Cramps similar to those that can happen during a menstrual period. The cramps may happen with diarrhea. Pain in the abdomen or lower back. Regular uterine contractions that may feel like tightening of the abdomen. A feeling of increased pressure in the pelvis. Increased watery or bloody mucus discharge from the vagina. Water breaking (ruptured amniotic sac). Why is it important to recognize signs of preterm labor? It is important to recognize signs of preterm labor because babies who are born prematurely may not be fully developed. This can put them at an increased risk for: Long-term (chronic) heart and lung problems. Difficulty immediately after birth with regulating body systems, including blood sugar, body temperature, heart rate, and breathing rate. Bleeding in the  brain. Cerebral palsy. Learning difficulties. Death. These risks are highest for babies who are born before 32 weeks of pregnancy. How is preterm labor treated? Treatment depends on the length of your pregnancy, your condition, and the health of your baby. It may involve: Having a stitch (suture) placed in your cervix to prevent your cervix from opening too early (cerclage). Taking or being given medicines, such as: Hormone medicines. These may be given early in pregnancy to help support the pregnancy. Medicine to stop contractions. Medicines to help mature the baby's lungs. These may be prescribed if the risk of delivery is high. Medicines to prevent your baby from developing cerebral palsy. If the labor happens before 34 weeks of pregnancy, you may need to stay in the hospital. What should I do if I think I am in preterm labor? If you think that you are going into preterm labor, call your health care provider right away. How can I prevent preterm labor in future pregnancies? To increase your chance of having a full-term pregnancy: Do not use any tobacco products, such as cigarettes, chewing tobacco, and e-cigarettes. If you need help quitting, ask your health care provider. Do not use street drugs or medicines that have not been prescribed to you during your pregnancy. Talk with your health care provider before taking any herbal supplements, even if you have been taking them regularly. Make sure you gain a healthy amount of weight during your pregnancy. Watch for infection. If you think that you might have an infection, get it checked right away. Make sure to tell your health care provider if you have gone into preterm labor before. This information is not intended to replace advice given to you by your health care provider. Make sure you discuss any questions you have with your health care provider. Document Revised: 03/27/2019 Document Reviewed: 04/25/2016 Elsevier Patient Education   [place].

## 2022-06-27 LAB — CERVICOVAGINAL ANCILLARY ONLY
Chlamydia: NEGATIVE
Comment: NEGATIVE
Comment: NORMAL
Neisseria Gonorrhea: NEGATIVE

## 2022-06-30 LAB — CULTURE, BETA STREP (GROUP B ONLY): Strep Gp B Culture: NEGATIVE

## 2022-07-03 ENCOUNTER — Encounter: Payer: Self-pay | Admitting: Medical

## 2022-07-03 ENCOUNTER — Ambulatory Visit (INDEPENDENT_AMBULATORY_CARE_PROVIDER_SITE_OTHER): Payer: Medicaid Other | Admitting: Medical

## 2022-07-03 VITALS — BP 103/67 | HR 85 | Wt 182.0 lb

## 2022-07-03 DIAGNOSIS — O26899 Other specified pregnancy related conditions, unspecified trimester: Secondary | ICD-10-CM

## 2022-07-03 DIAGNOSIS — O09299 Supervision of pregnancy with other poor reproductive or obstetric history, unspecified trimester: Secondary | ICD-10-CM

## 2022-07-03 DIAGNOSIS — F17200 Nicotine dependence, unspecified, uncomplicated: Secondary | ICD-10-CM

## 2022-07-03 DIAGNOSIS — Z6791 Unspecified blood type, Rh negative: Secondary | ICD-10-CM

## 2022-07-03 DIAGNOSIS — Z3480 Encounter for supervision of other normal pregnancy, unspecified trimester: Secondary | ICD-10-CM

## 2022-07-03 DIAGNOSIS — Z3A37 37 weeks gestation of pregnancy: Secondary | ICD-10-CM

## 2022-07-03 NOTE — Progress Notes (Signed)
   PRENATAL VISIT NOTE  Subjective:  Tonya Parks is a 26 y.o. G3P1011 at [redacted]w[redacted]d being seen today for ongoing prenatal care.  She is currently monitored for the following issues for this low-risk pregnancy and has History of abnormal cervical Pap smear; Encounter for supervision of normal pregnancy, antepartum; Rh negative state in antepartum period; Smoker; and Prior fetal macrosomia, antepartum on their problem list.  Patient reports occasional contractions.  Contractions: Not present. Vag. Bleeding: None.  Movement: Present. Denies leaking of fluid.   The following portions of the patient's history were reviewed and updated as appropriate: allergies, current medications, past family history, past medical history, past social history, past surgical history and problem list.   Objective:   Vitals:   07/03/22 1122  BP: 103/67  Pulse: 85  Weight: 182 lb (82.6 kg)    Fetal Status: Fetal Heart Rate (bpm): 141 Fundal Height: 37 cm Movement: Present  Presentation: Vertex  General:  Alert, oriented and cooperative. Patient is in no acute distress.  Skin: Skin is warm and dry. No rash noted.   Cardiovascular: Normal heart rate noted  Respiratory: Normal respiratory effort, no problems with respiration noted  Abdomen: Soft, gravid, appropriate for gestational age.  Pain/Pressure: Present     Pelvic: Cervical exam performed in the presence of a chaperone Dilation: 2 Effacement (%): 80 Station: -2  Extremities: Normal range of motion.  Edema: None  Mental Status: Normal mood and affect. Normal behavior. Normal judgment and thought content.   Assessment and Plan:  Pregnancy: G3P1011 at [redacted]w[redacted]d 1. Supervision of other normal pregnancy, antepartum - Reviewed negative GBS, GC/CT from last visit   2. Prior fetal macrosomia, antepartum - Follow-up growth [place] 7/25  3. Smoker  4. Rh negative state in antepartum period - Rhogam given at 28 weeks   5. [redacted] weeks gestation of pregnancy  Term labor  symptoms and general obstetric precautions including but not limited to vaginal bleeding, contractions, leaking of fluid and fetal movement were reviewed in detail with the patient. Please refer to After Visit Summary for other counseling recommendations.   Return in about 1 week (around 07/10/2022) for LOB, In-Person, any provider.  Future Appointments  Date Time Provider Department Center  07/10/2022  9:30 AM Cheral Marker, CNM [facility]  07/17/2022 10:45 AM [facility] [place] [facility] None  07/17/2022 11:50 AM Cheral Marker, CNM [facility]    Vonzella Nipple, PA-C

## 2022-07-03 NOTE — Progress Notes (Signed)
ROB 37.[redacted] wks GA Diarrhea since 07/02/22, no fever or malaise Requests SVE today

## 2022-07-10 ENCOUNTER — Ambulatory Visit (INDEPENDENT_AMBULATORY_CARE_PROVIDER_SITE_OTHER): Payer: Medicaid Other | Admitting: Women's Health

## 2022-07-10 ENCOUNTER — Encounter: Payer: Self-pay | Admitting: Women's Health

## 2022-07-10 VITALS — BP 116/75 | HR 88 | Wt 185.0 lb

## 2022-07-10 DIAGNOSIS — Z3483 Encounter for supervision of other normal pregnancy, third trimester: Secondary | ICD-10-CM

## 2022-07-10 NOTE — Progress Notes (Signed)
LOW-RISK PREGNANCY VISIT Patient name: Tonya Parks MRN [id number]  Date of birth: 03-Jan-1996 Chief Complaint:   Routine Prenatal Visit  History of Present Illness:   Tonya Parks is a 26 y.o. G64P1011 female at 106w1d with an Estimated Date of Delivery: 07/23/22 being seen today for ongoing management of a low-risk pregnancy.   Today she reports no complaints. Wants membrane sweep-discussed has to be 39wks, so will plan for next week. Wants to come out of work 8/1, discussed it is not medically necessary, so would just need to discuss w/ employer.  Contractions: Not present.  .  Movement: Present. denies leaking of fluid.     01/18/2022    1:43 PM 08/15/2021    9:45 AM  Depression screen PHQ 2/9  Decreased Interest 0 0  Down, Depressed, Hopeless 0 0  PHQ - 2 Score 0 0  Altered sleeping 0 0  Tired, decreased energy 1 0  Change in appetite 1 0  Feeling bad or failure about yourself  0 0  Trouble concentrating 0 0  Moving slowly or fidgety/restless 0 0  Suicidal thoughts 0 0  PHQ-9 Score 2 0        01/18/2022    1:43 PM 08/15/2021    9:47 AM  GAD 7 : Generalized Anxiety Score  Nervous, Anxious, on Edge 0 0  Control/stop worrying 0 0  Worry too much - different things 0 0  Trouble relaxing 0 0  Restless 0 0  Easily annoyed or irritable 0 0  Afraid - awful might happen 0 0  Total GAD 7 Score 0 0      Review of Systems:   Pertinent items are noted in HPI Denies abnormal vaginal discharge w/ itching/odor/irritation, headaches, visual changes, shortness of breath, chest pain, abdominal pain, severe nausea/vomiting, or problems with urination or bowel movements unless otherwise stated above. Pertinent History Reviewed:  Reviewed past medical,surgical, social, obstetrical and family history.  Reviewed problem list, medications and allergies. Physical Assessment:   Vitals:   07/10/22 0926  BP: 116/75  Pulse: 88  Weight: 185 lb (83.9 kg)  Body mass index is 28.98 kg/m.         Physical Examination:   General appearance: Well appearing, and in no distress  Mental status: Alert, oriented to person, place, and time  Skin: Warm & dry  Cardiovascular: Normal heart rate noted  Respiratory: Normal respiratory effort, no distress  Abdomen: Soft, gravid, nontender  Pelvic: Cervical exam performed  Dilation: 3 Effacement (%): 80 Station: -2  Extremities: Edema: None  Fetal Status: Fetal Heart Rate (bpm): 150 Fundal Height: 34 cm Movement: Present Presentation: Vertex  Chaperone:  Bien CMA    No results found for this or any previous visit (from the past 24 hour(s)).  Assessment & Plan:  1) Low-risk pregnancy G3P1011 at [redacted]w[redacted]d with an Estimated Date of Delivery: 07/23/22   2) H/O LGA, EFW planned for 8/1   Meds: No orders of the defined types were placed in this encounter.  Labs/procedures today: SVE  Plan:  Continue routine obstetrical care  Next visit: prefers in person    Reviewed: Term labor symptoms and general obstetric precautions including but not limited to vaginal bleeding, contractions, leaking of fluid and fetal movement were reviewed in detail with the patient.  All questions were answered. Does have home bp cuff. Office bp cuff given: not applicable. Check bp weekly, let us know if consistently >140 and/or >90.  Follow-up: Return for As scheduled,  cancel 8/4 NST please.  Future Appointments  Date Time Provider Department Center  07/17/2022 10:45 AM [facility] - [facility] [place] [facility] None  07/17/2022 11:50 AM Cheral Marker, CNM [facility]  07/20/2022 10:30 AM [facility] NURSE [facility]  07/23/2022 10:10 AM Cresenzo-Dishmon, Scarlette Calico, CNM [facility]    No orders of the defined types were placed in this encounter.  Cheral Marker CNM, [facility] 07/10/2022 9:52 AM

## 2022-07-10 NOTE — Progress Notes (Signed)
Patient would like her cervix check and request if she can get a membrane sweep. Patient would like to see if she can get her growth ultrasound today instead of on 07/17/2022.

## 2022-07-10 NOTE — Patient Instructions (Signed)
Koby, thank you for choosing our office today! We appreciate the opportunity to meet your healthcare needs. You may receive a short survey by mail, e-mail, or through MyChart. If you are happy with your care we would appreciate if you could take just a few minutes to complete the survey questions. We read all of your comments and take your feedback very seriously. Thank you again for choosing our office.  [facility] ([address] [place], [place] [address]) Entrance C, located off of [address] Free 24/7 valet parking   CLASSES: Go to [email or web address] to register for classes (childbirth, breastfeeding, waterbirth, infant CPR, daddy bootcamp, etc.)  Call the office ([phone]) or go to [facility] if: You begin to have strong, frequent contractions Your water breaks.  Sometimes it is a big gush of fluid, sometimes it is just a trickle that keeps getting your panties wet or running down your legs You have vaginal bleeding.  It is normal to have a small amount of spotting if your cervix was checked.  You don't feel your baby moving like normal.  If you don't, get you something to eat and drink and lay down and focus on feeling your baby move.   If your baby is still not moving like normal, you should call the office or go to [facility].  Call the office ([phone]) or go to [facility] for these signs of pre-eclampsia: Severe headache that does not go away with Tylenol Visual changes- seeing spots, double, blurred vision Pain under your right breast or upper abdomen that does not go away with Tums or heartburn medicine Nausea and/or vomiting Severe swelling in your hands, feet, and face   [place] Pediatricians/Family Doctors [facility] ([facility]): [address], [phone]           [facility]: [address], [phone]                 [facility] ([facility]): [address], [phone] (call to ask if accepting patients) [facility]: [address], [place], [phone]    [place] Pediatricians/Family Doctors [facility] ([facility]): [address], [phone] [facility]: [address], [phone] [facility]: [address], [phone]  [place] [facility]  [facility] ([facility]): [phone] [facility]: [address], [phone]   [place] Family Doctors [facility]: [address], [phone]  [place] [facility]  [facility]: [address], [phone]  Home Blood Pressure Monitoring for Patients   Your provider has recommended that you check your blood pressure (BP) at least once a week at home. If you do not have a blood pressure cuff at home, one will be provided for you. Contact your provider if you have not received your monitor within 1 week.   Helpful Tips for Accurate Home Blood Pressure Checks  Don't smoke, exercise, or drink caffeine 30 minutes before checking your BP Use the restroom before checking your BP (a full bladder can raise your pressure) Relax in a comfortable upright chair Feet on the ground Left arm resting comfortably on a flat surface at the level of your heart Legs uncrossed Back supported Sit quietly and don't talk Place the cuff on your bare arm Adjust snuggly, so that only two fingertips   can fit between your skin and the top of the cuff Check 2 readings separated by at least one minute Keep a log of your BP readings For a visual, please reference this diagram: http://ccnc.care/bpdiagram  Provider Name: [facility]     Phone: [phone]  Zone 1: ALL CLEAR  Continue to monitor your symptoms:  BP reading is less than 140 (top number) or less than 90 (bottom number)  No right  upper stomach pain No headaches or seeing spots No feeling nauseated or throwing up No swelling in face and hands  Zone 2: CAUTION Call your doctor's office for any of the following:  BP reading is greater than 140 (top number) or greater than 90 (bottom number)  Stomach pain under your ribs in the middle or right side Headaches or seeing spots Feeling nauseated or throwing up Swelling in face and hands  Zone 3: EMERGENCY  Seek immediate medical care if you have any of the following:  BP reading is greater than160 (top number) or greater than 110 (bottom number) Severe headaches not improving with Tylenol Serious difficulty catching your breath Any worsening symptoms from Zone 2   Braxton Hicks Contractions Contractions of the uterus can occur throughout pregnancy, but they are not always a sign that you are in labor. You may have practice contractions called Braxton Hicks contractions. These false labor contractions are sometimes confused with true labor. What are [name] contractions? Braxton Hicks contractions are tightening movements that occur in the muscles of the uterus before labor. Unlike true labor contractions, these contractions do not result in opening (dilation) and thinning of the cervix. Toward the end of pregnancy (32-34 weeks), Braxton Hicks contractions can happen more often and may become stronger. These contractions are sometimes difficult to tell apart from true labor because they can be very uncomfortable. You should not feel embarrassed if you go to the hospital with false labor. Sometimes, the only way to tell if you are in true labor is for your health care provider to look for changes in the cervix. The health care provider will do a physical exam and may monitor your contractions. If you are not in true labor, the exam should show that your cervix is not dilating and your water has not broken. If there are no other health problems associated with your  pregnancy, it is completely safe for you to be sent home with false labor. You may continue to have Braxton Hicks contractions until you go into true labor. How to tell the difference between true labor and false labor True labor Contractions last 30-70 seconds. Contractions become very regular. Discomfort is usually felt in the top of the uterus, and it spreads to the lower abdomen and low back. Contractions do not go away with walking. Contractions usually become more intense and increase in frequency. The cervix dilates and gets thinner. False labor Contractions are usually shorter and not as strong as true labor contractions. Contractions are usually irregular. Contractions are often felt in the front of the lower abdomen and in the groin. Contractions may go away when you walk around or change positions while lying down. Contractions get weaker and are shorter-lasting as time goes on. The cervix usually does not dilate or become thin. Follow these instructions at home:  Take over-the-counter and prescription medicines only as told by your health care provider. Keep up with your usual exercises and follow other instructions from your health care provider. Eat and drink lightly if you think  you are going into labor. If Braxton Hicks contractions are making you uncomfortable: Change your position from lying down or resting to walking, or change from walking to resting. Sit and rest in a tub of warm water. Drink enough fluid to keep your urine pale yellow. Dehydration may cause these contractions. Do slow and deep breathing several times an hour. Keep all follow-up prenatal visits as told by your health care provider. This is important. Contact a health care provider if: You have a fever. You have continuous pain in your abdomen. Get help right away if: Your contractions become stronger, more regular, and closer together. You have fluid leaking or gushing from your vagina. You pass  blood-tinged mucus (bloody show). You have bleeding from your vagina. You have low back pain that you never had before. You feel your baby's head pushing down and causing pelvic pressure. Your baby is not moving inside you as much as it used to. Summary Contractions that occur before labor are called Braxton Hicks contractions, false labor, or practice contractions. Braxton Hicks contractions are usually shorter, weaker, farther apart, and less regular than true labor contractions. True labor contractions usually become progressively stronger and regular, and they become more frequent. Manage discomfort from [facility] contractions by changing position, resting in a warm bath, drinking plenty of water, or practicing deep breathing. This information is not intended to replace advice given to you by your health care provider. Make sure you discuss any questions you have with your health care provider. Document Revised: 11/15/2017 Document Reviewed: 04/18/2017 Elsevier Patient Education  [place].

## 2022-07-17 ENCOUNTER — Ambulatory Visit (INDEPENDENT_AMBULATORY_CARE_PROVIDER_SITE_OTHER): Payer: Medicaid Other | Admitting: Women's Health

## 2022-07-17 ENCOUNTER — Encounter: Payer: Self-pay | Admitting: Women's Health

## 2022-07-17 ENCOUNTER — Ambulatory Visit (INDEPENDENT_AMBULATORY_CARE_PROVIDER_SITE_OTHER): Payer: Medicaid Other

## 2022-07-17 VITALS — BP 103/71 | HR 79 | Wt 184.0 lb

## 2022-07-17 DIAGNOSIS — Z3480 Encounter for supervision of other normal pregnancy, unspecified trimester: Secondary | ICD-10-CM

## 2022-07-17 DIAGNOSIS — Z3483 Encounter for supervision of other normal pregnancy, third trimester: Secondary | ICD-10-CM | POA: Diagnosis not present

## 2022-07-17 DIAGNOSIS — O09293 Supervision of pregnancy with other poor reproductive or obstetric history, third trimester: Secondary | ICD-10-CM | POA: Diagnosis not present

## 2022-07-17 DIAGNOSIS — Z3A39 39 weeks gestation of pregnancy: Secondary | ICD-10-CM

## 2022-07-17 DIAGNOSIS — Z6791 Unspecified blood type, Rh negative: Secondary | ICD-10-CM

## 2022-07-17 NOTE — Progress Notes (Signed)
ROB 39.1 wks [place] today SVE today, request membrane sweep, GBS negative

## 2022-07-17 NOTE — Progress Notes (Signed)
[place] 39+1 wks,cephalic,FHR 130 bpm,anterior placenta gr 3,AFI 14 cm,EFW 3981 g 87%

## 2022-07-17 NOTE — Progress Notes (Signed)
LOW-RISK PREGNANCY VISIT Patient name: Tonya Parks MRN [id number]  Date of birth: 09-28-1996 Chief Complaint:   Routine Prenatal Visit  History of Present Illness:   Tonya Parks is a 26 y.o. G65P1011 female at [redacted]w[redacted]d with an Estimated Date of Delivery: 07/23/22 being seen today for ongoing management of a low-risk pregnancy.   Today she reports pelvic pressure, wants membrane sweep.  Contractions: Irritability. Vag. Bleeding: None.  Movement: Present. denies leaking of fluid.     01/18/2022    1:43 PM 08/15/2021    9:45 AM  Depression screen PHQ 2/9  Decreased Interest 0 0  Down, Depressed, Hopeless 0 0  PHQ - 2 Score 0 0  Altered sleeping 0 0  Tired, decreased energy 1 0  Change in appetite 1 0  Feeling bad or failure about yourself  0 0  Trouble concentrating 0 0  Moving slowly or fidgety/restless 0 0  Suicidal thoughts 0 0  PHQ-9 Score 2 0        01/18/2022    1:43 PM 08/15/2021    9:47 AM  GAD 7 : Generalized Anxiety Score  Nervous, Anxious, on Edge 0 0  Control/stop worrying 0 0  Worry too much - different things 0 0  Trouble relaxing 0 0  Restless 0 0  Easily annoyed or irritable 0 0  Afraid - awful might happen 0 0  Total GAD 7 Score 0 0      Review of Systems:   Pertinent items are noted in HPI Denies abnormal vaginal discharge w/ itching/odor/irritation, headaches, visual changes, shortness of breath, chest pain, abdominal pain, severe nausea/vomiting, or problems with urination or bowel movements unless otherwise stated above. Pertinent History Reviewed:  Reviewed past medical,surgical, social, obstetrical and family history.  Reviewed problem list, medications and allergies. Physical Assessment:   Vitals:   07/17/22 1147  BP: 103/71  Pulse: 79  Weight: 184 lb (83.5 kg)  Body mass index is 28.82 kg/m.        Physical Examination:   General appearance: Well appearing, and in no distress  Mental status: Alert, oriented to person, place, and  time  Skin: Warm & dry  Cardiovascular: Normal heart rate noted  Respiratory: Normal respiratory effort, no distress  Abdomen: Soft, gravid, nontender  Pelvic: Cervical exam performed  Dilation: 3 Effacement (%): 80 Station: -2, Offered membrane sweeping, discussed r/b- pt decided to proceed, so membranes swept.   Extremities: Edema: None  Fetal Status: Fetal Heart Rate (bpm): 130 u/s   Movement: Present Presentation: VertexUS 39+1 wks,cephalic,FHR 130 bpm,anterior placenta gr 3,AFI 14 cm,EFW 3981 g 87%  Chaperone:  Montez Morita, RN    No results found for this or any previous visit (from the past 24 hour(s)).  Assessment & Plan:  1) Low-risk pregnancy G3P1011 at [redacted]w[redacted]d with an Estimated Date of Delivery: 07/23/22   2) H/O LGA 4132G w/o complication, EFW today 3981g/87%   Meds: No orders of the defined types were placed in this encounter.  Labs/procedures today: SVE, membrane sweep, and U/S  Plan:  Continue routine obstetrical care  Next visit: prefers will be in person for postdates nst     Reviewed: Term labor symptoms and general obstetric precautions including but not limited to vaginal bleeding, contractions, leaking of fluid and fetal movement were reviewed in detail with the patient.  All questions were answered. Does have home bp cuff. Office bp cuff given: not applicable. Check bp weekly, let us know if consistently >140 and/or >90.  Follow-up: Return for cancel 8/4 nst, add nst to 8/7 appt.  Future Appointments  Date Time Provider Department Center  07/20/2022 10:30 AM [facility] NURSE [facility]  07/23/2022 10:10 AM Cresenzo-Dishmon, Scarlette Calico, CNM [facility]    No orders of the defined types were placed in this encounter.  Cheral Marker CNM, [facility] 07/17/2022 12:09 PM

## 2022-07-17 NOTE — Patient Instructions (Signed)
Tonya Parks, thank you for choosing our office today! We appreciate the opportunity to meet your healthcare needs. You may receive a short survey by mail, e-mail, or through [employer]. If you are happy with your care we would appreciate if you could take just a few minutes to complete the survey questions. We read all of your comments and take your feedback very seriously. Thank you again for choosing our office.  [facility] [employer] Team at [facility]  [facility] at [facility] ([address] [place], [place] [address]) Entrance C, located off of [address] Free 24/7 valet parking   CLASSES: Go to [email or web address] to register for classes (childbirth, breastfeeding, waterbirth, infant CPR, daddy bootcamp, etc.)  Call the office [phone]) or go to [facility] if: You begin to have strong, frequent contractions Your water breaks.  Sometimes it is a big gush of fluid, sometimes it is just a trickle that keeps getting your panties wet or running down your legs You have vaginal bleeding.  It is normal to have a small amount of spotting if your cervix was checked.  You don't feel your baby moving like normal.  If you don't, get you something to eat and drink and lay down and focus on feeling your baby move.   If your baby is still not moving like normal, you should call the office or go to [facility].  Call the office [phone]) or go to [facility] hospital for these signs of pre-eclampsia: Severe headache that does not go away with Tylenol Visual changes- seeing spots, double, blurred vision Pain under your right breast or upper abdomen that does not go away with Tums or heartburn medicine Nausea and/or vomiting Severe swelling in your hands, feet, and face   [facility] Pediatricians/Family Doctors [facility]): [address] Colette Parks, [phone]           [facility]: [address], [phone]                 [facility]): [address], [phone] (call to ask if accepting patients) [facility] Department: [address] [place], [phone]    [facility] Pediatricians/Family Doctors [facility]): [address], [phone] [facility]: [address] [place], [phone] [facility]: [address], [phone]  [facility] Doctors  [facility]): [phone] [facility]: [address] [phone]   [facility] Doctors [facility]: [address] [phone]  [facility] Doctors  [facility]: [phone], [phone]  Home Blood Pressure Monitoring for Patients   Your provider has recommended that you check your blood pressure (BP) at least once a week at home. If you do not have a blood pressure cuff at home, one will be provided for you. Contact your provider if you have not received your monitor within 1 week.   Helpful Tips for Accurate Home Blood Pressure Checks  Don't smoke, exercise, or drink caffeine 30 minutes before checking your BP Use the restroom before checking your BP (a full bladder can raise your pressure) Relax in a comfortable upright chair Feet on the ground Left arm resting comfortably on a flat surface at the level of your heart Legs uncrossed Back supported Sit quietly and don't talk Place the cuff on your bare arm Adjust snuggly, so that only two fingertips  can fit between your skin and the top of the cuff Check 2 readings separated by at least one minute Keep a log of your BP readings For a visual, please reference this diagram: http://ccnc.care/bpdiagram  Provider Name: [facility]     Phone: [phone]  Zone 1: ALL CLEAR  Continue to monitor your symptoms:  BP reading is less than 140 (top number) or less than 90 (bottom number)  No right  upper stomach pain No headaches or seeing spots No feeling nauseated or throwing up No swelling in face and hands  Zone 2: CAUTION Call your doctor's office for any of the following:  BP reading is greater than 140 (top number) or greater than 90 (bottom number)  Stomach pain under your ribs in the middle or right side Headaches or seeing spots Feeling nauseated or throwing up Swelling in face and hands  Zone 3: EMERGENCY  Seek immediate medical care if you have any of the following:  BP reading is greater than160 (top number) or greater than 110 (bottom number) Severe headaches not improving with Tylenol Serious difficulty catching your breath Any worsening symptoms from Zone 2   Braxton Hicks Contractions Contractions of the uterus can occur throughout pregnancy, but they are not always a sign that you are in labor. You may have practice contractions called Braxton Hicks contractions. These false labor contractions are sometimes confused with true labor. What are [name] contractions? Braxton Hicks contractions are tightening movements that occur in the muscles of the uterus before labor. Unlike true labor contractions, these contractions do not result in opening (dilation) and thinning of the cervix. Toward the end of pregnancy (32-34 weeks), Braxton Hicks contractions can happen more often and may become stronger. These contractions are sometimes difficult to tell apart from true labor because they can be very uncomfortable. You should not feel embarrassed if you go to the hospital with false labor. Sometimes, the only way to tell if you are in true labor is for your health care provider to look for changes in the cervix. The health care provider will do a physical exam and may monitor your contractions. If you are not in true labor, the exam should show that your cervix is not dilating and your water has not broken. If there are no other health problems associated with your  pregnancy, it is completely safe for you to be sent home with false labor. You may continue to have Braxton Hicks contractions until you go into true labor. How to tell the difference between true labor and false labor True labor Contractions last 30-70 seconds. Contractions become very regular. Discomfort is usually felt in the top of the uterus, and it spreads to the lower abdomen and low back. Contractions do not go away with walking. Contractions usually become more intense and increase in frequency. The cervix dilates and gets thinner. False labor Contractions are usually shorter and not as strong as true labor contractions. Contractions are usually irregular. Contractions are often felt in the front of the lower abdomen and in the groin. Contractions may go away when you walk around or change positions while lying down. Contractions get weaker and are shorter-lasting as time goes on. The cervix usually does not dilate or become thin. Follow these instructions at home:  Take over-the-counter and prescription medicines only as told by your health care provider. Keep up with your usual exercises and follow other instructions from your health care provider. Eat and drink lightly if you think  you are going into labor. If Braxton Hicks contractions are making you uncomfortable: Change your position from lying down or resting to walking, or change from walking to resting. Sit and rest in a tub of warm water. Drink enough fluid to keep your urine pale yellow. Dehydration may cause these contractions. Do slow and deep breathing several times an hour. Keep all follow-up prenatal visits as told by your health care provider. This is important. Contact a health care provider if: You have a fever. You have continuous pain in your abdomen. Get help right away if: Your contractions become stronger, more regular, and closer together. You have fluid leaking or gushing from your vagina. You pass  blood-tinged mucus (bloody show). You have bleeding from your vagina. You have low back pain that you never had before. You feel your baby's head pushing down and causing pelvic pressure. Your baby is not moving inside you as much as it used to. Summary Contractions that occur before labor are called Braxton Hicks contractions, false labor, or practice contractions. Braxton Hicks contractions are usually shorter, weaker, farther apart, and less regular than true labor contractions. True labor contractions usually become progressively stronger and regular, and they become more frequent. Manage discomfort from [facility] contractions by changing position, resting in a warm bath, drinking plenty of water, or practicing deep breathing. This information is not intended to replace advice given to you by your health care provider. Make sure you discuss any questions you have with your health care provider. Document Revised: 11/15/2017 Document Reviewed: 04/18/2017 Elsevier Patient Education  [place].

## 2022-07-20 ENCOUNTER — Other Ambulatory Visit: Payer: Medicaid Other

## 2022-07-22 ENCOUNTER — Inpatient Hospital Stay (HOSPITAL_COMMUNITY): Payer: Medicaid Other | Admitting: Anesthesiology

## 2022-07-22 ENCOUNTER — Encounter (HOSPITAL_COMMUNITY): Payer: Self-pay | Admitting: Obstetrics and Gynecology

## 2022-07-22 ENCOUNTER — Inpatient Hospital Stay (HOSPITAL_COMMUNITY)
Admission: AD | Admit: 2022-07-22 | Discharge: 2022-07-24 | DRG: 798 | Disposition: A | Discharge status: Home / Self Care | Payer: Medicaid Other | Attending: Obstetrics & Gynecology | Admitting: Obstetrics & Gynecology

## 2022-07-22 ENCOUNTER — Inpatient Hospital Stay (HOSPITAL_COMMUNITY): Payer: Medicaid Other | Admitting: Certified Registered Nurse Anesthetist

## 2022-07-22 ENCOUNTER — Other Ambulatory Visit: Payer: Self-pay

## 2022-07-22 DIAGNOSIS — Z3480 Encounter for supervision of other normal pregnancy, unspecified trimester: Principal | ICD-10-CM

## 2022-07-22 DIAGNOSIS — O4292 Full-term premature rupture of membranes, unspecified as to length of time between rupture and onset of labor: Secondary | ICD-10-CM | POA: Diagnosis present

## 2022-07-22 DIAGNOSIS — Z302 Encounter for sterilization: Secondary | ICD-10-CM

## 2022-07-22 DIAGNOSIS — O26899 Other specified pregnancy related conditions, unspecified trimester: Secondary | ICD-10-CM

## 2022-07-22 DIAGNOSIS — O99334 Smoking (tobacco) complicating childbirth: Secondary | ICD-10-CM | POA: Diagnosis present

## 2022-07-22 DIAGNOSIS — Z3A39 39 weeks gestation of pregnancy: Secondary | ICD-10-CM | POA: Diagnosis not present

## 2022-07-22 DIAGNOSIS — F17210 Nicotine dependence, cigarettes, uncomplicated: Secondary | ICD-10-CM | POA: Diagnosis present

## 2022-07-22 DIAGNOSIS — O4202 Full-term premature rupture of membranes, onset of labor within 24 hours of rupture: Secondary | ICD-10-CM | POA: Diagnosis not present

## 2022-07-22 DIAGNOSIS — O09299 Supervision of pregnancy with other poor reproductive or obstetric history, unspecified trimester: Secondary | ICD-10-CM

## 2022-07-22 DIAGNOSIS — Z6791 Unspecified blood type, Rh negative: Secondary | ICD-10-CM

## 2022-07-22 DIAGNOSIS — O26893 Other specified pregnancy related conditions, third trimester: Secondary | ICD-10-CM | POA: Diagnosis present

## 2022-07-22 LAB — CBC
HCT: 35.8 % — ABNORMAL LOW (ref 36.0–46.0)
Hemoglobin: 11.9 g/dL — ABNORMAL LOW (ref 12.0–15.0)
MCH: 31 pg (ref 26.0–34.0)
MCHC: 33.2 g/dL (ref 30.0–36.0)
MCV: 93.2 fL (ref 80.0–100.0)
Platelets: 262 10*3/uL (ref 150–400)
RBC: 3.84 MIL/uL — ABNORMAL LOW (ref 3.87–5.11)
RDW: 13.2 % (ref 11.5–15.5)
WBC: 19.4 10*3/uL — ABNORMAL HIGH (ref 4.0–10.5)
nRBC: 0 % (ref 0.0–0.2)

## 2022-07-22 LAB — TYPE AND SCREEN
ABO/RH(D): O NEG
Antibody Screen: POSITIVE

## 2022-07-22 LAB — POCT FERN TEST: POCT Fern Test: POSITIVE

## 2022-07-22 MED ORDER — EPHEDRINE 5 MG/ML INJ
10.0000 mg | INTRAVENOUS | Status: DC | PRN
Start: 2022-07-22 — End: 2022-07-23

## 2022-07-22 MED ORDER — OXYTOCIN-SODIUM CHLORIDE 30-0.9 UT/500ML-% IV SOLN
1.0000 m[IU]/min | INTRAVENOUS | Status: DC
  Administered 2022-07-22: 2 m[IU]/min via INTRAVENOUS
  Filled 2022-07-22: qty 500

## 2022-07-22 MED ORDER — FENTANYL-BUPIVACAINE-NACL 0.5-0.125-0.9 MG/250ML-% EP SOLN
12.0000 mL/h | EPIDURAL | Status: DC | PRN
  Administered 2022-07-22: 12 mL/h via EPIDURAL
  Filled 2022-07-22: qty 250

## 2022-07-22 MED ORDER — ACETAMINOPHEN 325 MG PO TABS
650.0000 mg | ORAL_TABLET | ORAL | Status: DC | PRN
Start: 2022-07-22 — End: 2022-07-23

## 2022-07-22 MED ORDER — LACTATED RINGERS IV SOLN: 500.0000 mL | INTRAVENOUS | Status: DC | PRN

## 2022-07-22 MED ORDER — PHENYLEPHRINE 80 MCG/ML (10ML) SYRINGE FOR IV PUSH (FOR BLOOD PRESSURE SUPPORT): 80.0000 ug | PREFILLED_SYRINGE | INTRAVENOUS | Status: DC | PRN

## 2022-07-22 MED ORDER — TERBUTALINE SULFATE 1 MG/ML IJ SOLN: 0.2500 mg | Freq: Once | INTRAMUSCULAR | Status: DC | PRN

## 2022-07-22 MED ORDER — OXYTOCIN BOLUS FROM INFUSION
333.0000 mL | Freq: Once | INTRAVENOUS | Status: AC
  Administered 2022-07-22: 333 mL via INTRAVENOUS

## 2022-07-22 MED ORDER — LACTATED RINGERS IV SOLN: INTRAVENOUS | Status: DC

## 2022-07-22 MED ORDER — ONDANSETRON HCL 4 MG/2ML IJ SOLN: 4.0000 mg | Freq: Four times a day (QID) | INTRAMUSCULAR | Status: DC | PRN

## 2022-07-22 MED ORDER — OXYTOCIN-SODIUM CHLORIDE 30-0.9 UT/500ML-% IV SOLN
2.5000 [IU]/h | INTRAVENOUS | Status: DC
  Administered 2022-07-22: 2.5 [IU]/h via INTRAVENOUS

## 2022-07-22 MED ORDER — FENTANYL CITRATE (PF) 100 MCG/2ML IJ SOLN
INTRAMUSCULAR | Status: AC | PRN
  Administered 2022-07-22: 100 ug via EPIDURAL

## 2022-07-22 MED ORDER — DIPHENHYDRAMINE HCL 50 MG/ML IJ SOLN: 12.5000 mg | INTRAMUSCULAR | Status: DC | PRN

## 2022-07-22 MED ORDER — FENTANYL CITRATE (PF) 100 MCG/2ML IJ SOLN
100.0000 ug | Freq: Once | INTRAMUSCULAR | Status: AC | PRN
  Administered 2022-07-22: 100 ug via INTRAVENOUS
  Filled 2022-07-22: qty 2

## 2022-07-22 MED ORDER — OXYCODONE-ACETAMINOPHEN 5-325 MG PO TABS: 1.0000 | ORAL_TABLET | ORAL | Status: DC | PRN

## 2022-07-22 MED ORDER — LIDOCAINE-EPINEPHRINE (PF) 2 %-1:200000 IJ SOLN
INTRAMUSCULAR | Status: AC | PRN
  Administered 2022-07-22: 5 mL via EPIDURAL

## 2022-07-22 MED ORDER — OXYCODONE-ACETAMINOPHEN 5-325 MG PO TABS: 2.0000 | ORAL_TABLET | ORAL | Status: DC | PRN

## 2022-07-22 MED ORDER — LIDOCAINE HCL (PF) 1 % IJ SOLN
INTRAMUSCULAR | Status: DC | PRN
  Administered 2022-07-22: 9 mL via EPIDURAL

## 2022-07-22 MED ORDER — SOD CITRATE-CITRIC ACID 500-334 MG/5ML PO SOLN: 30.0000 mL | ORAL | Status: DC | PRN

## 2022-07-22 MED ORDER — LIDOCAINE HCL (PF) 1 % IJ SOLN: 30.0000 mL | INTRAMUSCULAR | Status: DC | PRN

## 2022-07-22 MED ORDER — FENTANYL CITRATE (PF) 100 MCG/2ML IJ SOLN
INTRAMUSCULAR | Status: AC
  Filled 2022-07-22: qty 2

## 2022-07-22 MED ORDER — LACTATED RINGERS IV SOLN: 500.0000 mL | Freq: Once | INTRAVENOUS | Status: DC

## 2022-07-22 NOTE — H&P (Signed)
Tonya Parks is a 26 y.o. G60P1011 female at [redacted]w[redacted]d by LMP c/w 8.4wk u/s presenting with leaking fluid since ~1000.   Reports active fetal movement, contractions: irreg, mild, vaginal bleeding: none, membranes: intact, ruptured with clear fluid.  Initiated prenatal care at [facility] at 8 wks.   Most recent u/s : [redacted]w[redacted]d ceph, ant placenta, AFI 14cm, EFW 87% (3981g).   This pregnancy complicated by: # Rh neg # smoker # request for ppBTL (papers 06/11/22 under media)  Prenatal History/Complications:  # hx LGA (4167g) vag del without complications  Past Medical History: Past Medical History:  Diagnosis Date   Asthma    Migraine    Vaginal Pap smear, abnormal     Past Surgical History: Past Surgical History:  Procedure Laterality Date   NO PAST SURGERIES      Obstetrical History: OB History     Gravida  3   Para  1   Term  1   Preterm      AB  1   Living  1      SAB  1   IAB      Ectopic      Multiple      Live Births  1           Social History: Social History   Socioeconomic History   Marital status: Single    Spouse name: Not on file   Number of children: Not on file   Years of education: Not on file   Highest education level: Not on file  Occupational History   Not on file  Tobacco Use   Smoking status: Every Day    Packs/day: 1.00    Years: 8.00    Total pack years: 8.00    Types: Cigarettes   Smokeless tobacco: Never  Vaping Use   Vaping Use: Never used  Substance and Sexual Activity   Alcohol use: Not Currently    Comment: every other 2 weeks   Drug use: No   Sexual activity: Yes    Birth control/protection: None  Other Topics Concern   Not on file  Social History Narrative   Not on file   Social Determinants of Health   Financial Resource Strain: Low Risk  (01/18/2022)   Overall Financial Resource Strain (CARDIA)    Difficulty of Paying Living Expenses: Not hard at all  Food Insecurity: No Food Insecurity (01/18/2022)   Hunger  Vital Sign    Worried About Running Out of Food in the Last Year: Never true    Ran Out of Food in the Last Year: Never true  Transportation Needs: No Transportation Needs (01/18/2022)   PRAPARE - Administrator, Civil Service (Medical): No    Lack of Transportation (Non-Medical): No  Physical Activity: Inactive (01/18/2022)   Exercise Vital Sign    Days of Exercise per Week: 0 days    Minutes of Exercise per Session: 0 min  Stress: No Stress Concern Present (01/18/2022)   [employer] of Occupational Health - Occupational Stress Questionnaire    Feeling of Stress : Not at all  Social Connections: Moderately Isolated (01/18/2022)   Social Connection and Isolation Panel [NHANES]    Frequency of Communication with Friends and Family: More than three times a week    Frequency of Social Gatherings with Friends and Family: Three times a week    Attends Religious Services: Never    Active Member of Clubs or Organizations: No    Attends Club  or Organization Meetings: Never    Marital Status: Living with partner    Family History: Family History  Problem Relation Age of Onset   Stroke Mother    COPD Mother    Asthma Mother    Pancreatitis Mother    Congestive Heart Failure Mother    Anxiety disorder Sister    Epilepsy Brother    ADD / ADHD Brother    Bipolar disorder Brother    Breast cancer Maternal Aunt    Cancer Maternal Grandmother        lung   COPD Maternal Grandfather    Other Paternal Grandmother        Covid pneumonia   Cancer Paternal Grandfather        Stage 4 stomach cancer    Allergies: No Known Allergies  Medications Prior to Admission  Medication Sig Dispense Refill Last Dose   Prenatal Vit-Fe Fumarate-FA (PRENATAL VITAMIN PO) Take by mouth.   07/22/2022   Blood Pressure Monitor MISC For regular home bp monitoring during pregnancy 1 each 0    cyclobenzaprine (FLEXERIL) 5 MG tablet Take 1 tablet (5 mg total) by mouth 3 (three) times daily as needed for  muscle spasms. 20 tablet 0 More than a month   Magnesium Oxide (MAG-OXIDE) 200 MG TABS Take 2 tablets (400 mg total) by mouth at bedtime. If that amount causes loose stools in the am, switch to 200mg  daily at bedtime. 60 tablet 3 More than a month   promethazine (PHENERGAN) 25 MG tablet Take 0.5-1 tablets (12.5-25 mg total) by mouth every 6 (six) hours as needed for nausea or vomiting. 30 tablet 6 More than a month    Review of Systems  Pertinent pos/neg as indicated in HPI  Blood pressure 113/72, pulse 75, temperature 98 F (36.7 C), temperature source Oral, resp. rate 16, height 5\' 7"  (1.702 m), weight 83.9 kg, last menstrual period 10/16/2021, SpO2 96 %. General appearance: alert, cooperative, and no distress Lungs: clear to auscultation bilaterally Heart: regular rate and rhythm Abdomen: gravid, soft, non-tender, EFW by Leopold's approximately 9lbs Extremities: 1+ edema   Fetal monitoring: FHR: 120s bpm, variability: moderate,  Accelerations: Present,  decelerations:  Absent Uterine activity: irreg Dilation: 3 Effacement (%): 70 Station: -2 Exam by:: 10/18/2021, CNM Presentation: cephalic   Prenatal labs: ABO, Rh: --/--/O NEG (08/06 1525) Antibody: POS (08/06 1525) Rubella: 1.03 (01/30 1416) RPR: Non Reactive (05/19 0847)  HBsAg: Negative (01/30 1416)  HIV: Non Reactive (05/19 0847)  GBS: Negative/-- (07/11 1600)  2hr GTT: 84/100/77  Prenatal Transfer Tool  Maternal Diabetes: No Genetic Screening: Normal Maternal Ultrasounds/Referrals: Normal Fetal Ultrasounds or other Referrals:  None Maternal Substance Abuse:  No Significant Maternal Medications:  None Significant Maternal Lab Results: Group B Strep negative and Rh negative  Results for orders placed or performed during the hospital encounter of 07/22/22 (from the past 24 hour(s))  CBC   Collection Time: 07/22/22  3:25 PM  Result Value Ref Range   WBC 19.4 (H) 4.0 - 10.5 K/uL   RBC 3.84 (L) 3.87 - 5.11 MIL/uL    Hemoglobin 11.9 (L) 12.0 - 15.0 g/dL   HCT 09/21/22 (L) 09/21/22 - 93.2 %   MCV 93.2 80.0 - 100.0 fL   MCH 31.0 26.0 - 34.0 pg   MCHC 33.2 30.0 - 36.0 g/dL   RDW 35.5 73.2 - 20.2 %   Platelets 262 150 - 400 K/uL   nRBC 0.0 0.0 - 0.2 %  Type and screen  [facility]   Collection Time: 07/22/22  3:25 PM  Result Value Ref Range   ABO/RH(D) O NEG    Antibody Screen POS    Sample Expiration      07/25/2022,2359 Performed at [facility], [address], [place], [place] [address]    Antibody Identification PENDING   Crist Fat Test   Collection Time: 07/22/22  3:33 PM  Result Value Ref Range   POCT Fern Test Positive = ruptured amniotic membanes      Assessment:  [redacted]w[redacted]d SIUP  G3P1011  PROM @ 1000  Cat 1 FHR  GBS Negative/-- (07/11 1600)  Plan:  Admit to L&D  IV pain meds/epidural prn active labor  Pitocin as induction method due to favorable cervix  Anticipate vag del   Plans to breastfeed (pump)  Contraception: BTL (30d papers signed under media 06/11/22)  Circumcision: no  Arabella Merles CNM 07/22/2022, 4:25 PM

## 2022-07-22 NOTE — Discharge Summary (Signed)
Postpartum Discharge Summary      Patient Name: Tonya Parks DOB: 1996-06-11 MRN: [id number]  Date of admission: 07/22/2022 Delivery date:07/22/2022  Delivering provider: Serita Grammes D  Date of discharge: 07/24/2022  Admitting diagnosis: Indication for care in labor or delivery [O75.9] Intrauterine pregnancy: [redacted]w[redacted]d    Secondary diagnosis:  Principal Problem:   Indication for care in labor or delivery Active Problems:   Rh negative state in antepartum period   Prior fetal macrosomia, antepartum   Request for sterilization  Additional problems: none    Discharge diagnosis: Term Pregnancy Delivered                                              Post partum procedures:postpartum tubal ligation Augmentation: Pitocin Complications: None  Hospital course: Induction of Labor With Vaginal Delivery   26y.o. yo G3P1011 at 356w6das admitted to the hospital 07/22/2022 for induction of labor.  Indication for induction: PROM.  Patient had an uncomplicated labor course as follows: Membrane Rupture Time/Date: 11:00 AM ,07/22/2022   Delivery Method:Vaginal, Spontaneous  Episiotomy: None  Lacerations:  None  Details of delivery can be found in separate delivery note.  Patient had a routine postpartum course with the addition of a ppBTL on PPD#1 which she tolerated well . Patient is discharged home 07/24/22.  Newborn Data: Birth date:07/22/2022  Birth time:10:27 PM  Gender:Female  Living status:Living  Apgars:8 ,9  Weight:3630 g (8lb)  Magnesium Sulfate received: No BMZ received: No Rhophylac:No, rule out occurred  MMR:N/A T-DaP: offered postpartum Flu: No Transfusion:No  Physical exam  Vitals:   07/23/22 1600 07/23/22 2019 07/24/22 0032 07/24/22 0449  BP: (!) 108/53 124/63 101/69 95/73  Pulse: 64 72 65 (!) 58  Resp: _0 Temp: 97.7 F (36.5 C) 97.8 F (36.6 C) 97.9 F (36.6 C) 97.8 F (36.6 C)  TempSrc: Oral Oral Oral Oral  SpO2: 96% 98% 100% 97%  Weight:      Height:        General: alert, cooperative, and no distress Lochia: appropriate Uterine Fundus: firm Incision: Dressing is clean, dry, and intact DVT Evaluation: No evidence of DVT seen on physical exam. Labs: Lab Results  Component Value Date   WBC 19.4 (H) 07/22/2022   HGB 11.9 (L) 07/22/2022   HCT 35.8 (L) 07/22/2022   MCV 93.2 07/22/2022   PLT 262 07/22/2022       No data to display         Edinburgh Score:    07/23/2022    4:00 PM  Edinburgh Postnatal Depression Scale Screening Tool  I have been able to laugh and see the funny side of things. 0  I have looked forward with enjoyment to things. 0  I have blamed myself unnecessarily when things went wrong. 0  I have been anxious or worried for no good reason. 0  I have felt scared or panicky for no good reason. 0  Things have been getting on top of me. 1  I have been so unhappy that I have had difficulty sleeping. 0  I have felt sad or miserable. 0  I have been so unhappy that I have been crying. 0  The thought of harming myself has occurred to me. 0  Edinburgh Postnatal Depression Scale Total 1     After visit meds:  Allergies as of 07/24/2022   No Known Allergies      Medication List     TAKE these medications    Blood Pressure Monitor Misc For regular home bp monitoring during pregnancy   cyclobenzaprine 5 MG tablet Commonly known as: FLEXERIL Take 1 tablet (5 mg total) by mouth 3 (three) times daily as needed for muscle spasms.   Magnesium Oxide -Mg Supplement 200 MG Tabs Commonly known as: Mag-Oxide Take 2 tablets (400 mg total) by mouth at bedtime. If that amount causes loose stools in the am, switch to 233m daily at bedtime.   oxyCODONE 5 MG immediate release tablet Commonly known as: Oxy IR/ROXICODONE Take 1 tablet (5 mg total) by mouth every 4 (four) hours as needed (pain scale 4-7).   PRENATAL VITAMIN PO Take by mouth.   promethazine 25 MG tablet Commonly known as: PHENERGAN Take 0.5-1 tablets  (12.5-25 mg total) by mouth every 6 (six) hours as needed for nausea or vomiting.         Discharge home in stable condition Infant Feeding: Bottle and Breast Infant Disposition:home with mother Discharge instruction: per After Visit Summary and Postpartum booklet. Activity: Advance as tolerated. Pelvic rest for 6 weeks.  Diet: routine diet Future Appointments: Future Appointments  Date Time Provider [place] 08/31/2022 10:30 AM SMyrtis Ser CNM [facility]   Follow up Visit: SMyrtis Ser CNM  PMeredeth IdeR Please schedule this patient for Postpartum visit in: 6 weeks with the following provider: Any provider  In-Person  For C/S patients schedule nurse incision check in weeks 2 weeks: no  Low risk pregnancy complicated by: none  Delivery mode:  SVD  Anticipated Birth Control:  BTL done PP  PP Procedures needed: none  Schedule Integrated BH visit: no    07/24/2022 JConcepcion Living MD

## 2022-07-22 NOTE — Progress Notes (Signed)
Patient ID: Tonya Parks, female   DOB: 27-Mar-1996, 26 y.o.   MRN: [id number]  Just had epidural placed; feeling ctx are shortening a bit but are still very uncomfortable  VSS, afebrile FHR 120s, +accels, no decels Ctx q 3 mins Cx 7/90/vtx 0 at 2057  IUP@39 .6wks Active labor  Check cx in 1-2 hrs or sooner prn pressure Anticipate vag del  Arabella Merles 07/22/2022 9:48 PM

## 2022-07-22 NOTE — MAU Provider Note (Signed)
Event Date/Time  First Provider Initiated Contact with Patient 07/22/22 1447      S: Ms. Cleopha Indelicato is a 26 y.o. G3P1011 at [redacted]w[redacted]d  who presents to MAU today complaining of leaking of fluid since 1000 today. She denies vaginal bleeding. She endorses contractions. She reports normal fetal movement.    O: BP 117/68   Pulse (!) 105   Temp 98.2 F (36.8 C) (Oral)   Resp 16   LMP 10/16/2021 (Approximate)   SpO2 96%   GENERAL: Well-developed, well-nourished female in no acute distress.  HEAD: Normocephalic, atraumatic.  CHEST: Normal effort of breathing, regular heart rate ABDOMEN: Soft, nontender, gravid PELVIC: Normal external female genitalia. Vagina is pink and rugated. Cervix with normal contour, no lesions. Normal discharge. Patient sitting in high fowler's, clear fluid visible on labia  Cervical exam: Deferred     Fetal Monitoring: Baseline: 120 Variability: Mod Accelerations: 15 x 15 Decelerations: N/A Contractions: Rare, UI   A: SIUP at [redacted]w[redacted]d  SROM at 1000 today Vertex confirmed with BSUS  P: Admit to L&D  Briant Sites 07/22/2022 3:31 PM

## 2022-07-22 NOTE — Anesthesia Procedure Notes (Signed)
Epidural Patient location during procedure: OB Start time: 07/22/2022 9:14 PM End time: 07/22/2022 9:17 PM  Staffing Anesthesiologist: Leilani Able, MD Performed: anesthesiologist   Preanesthetic Checklist Completed: patient identified, IV checked, site marked, risks and benefits discussed, surgical consent, monitors and equipment checked, pre-op evaluation and timeout performed  Epidural Patient position: sitting Prep: DuraPrep and site prepped and draped Patient monitoring: continuous pulse ox and blood pressure Approach: midline Location: L3-L4 Injection technique: LOR air  Needle:  Needle type: Tuohy  Needle gauge: 17 G Needle length: 9 cm and 9 Needle insertion depth: 5 cm cm Catheter type: closed end flexible Catheter size: 19 Gauge Catheter at skin depth: 10 cm Test dose: negative and Other  Assessment Events: blood not aspirated, injection not painful, no injection resistance, no paresthesia and negative IV test  Additional Notes Reason for block:procedure for pain

## 2022-07-22 NOTE — MAU Note (Signed)
.  Tonya Parks is a 26 y.o. at [redacted]w[redacted]d here in MAU reporting: Pt reports that she woke up this morning around 1000-1100 and made her something to eat. Pt reports clear fluid leaking down her leg around that time. Pt reports she took a bath and still had leaking.  Reports +FM  Denies vaginal bleeding Onset of complaint: today  Pain score: denies  There were no vitals filed for this visit.    Lab orders placed from triage:   none

## 2022-07-22 NOTE — Anesthesia Preprocedure Evaluation (Signed)
Anesthesia Evaluation  Patient identified by MRN, date of birth, ID band Patient awake    Reviewed: Allergy & Precautions, H&P , Patient's Chart, lab work & pertinent test results  Airway Mallampati: I       Dental no notable dental hx.    Pulmonary Current Smoker,    Pulmonary exam normal        Cardiovascular negative cardio ROS Normal cardiovascular exam     Neuro/Psych negative psych ROS   GI/Hepatic negative GI ROS, Neg liver ROS,   Endo/Other  negative endocrine ROS  Renal/GU negative Renal ROS  negative genitourinary   Musculoskeletal   Abdominal Normal abdominal exam  (+)   Peds  Hematology  (+) Blood dyscrasia, anemia ,   Anesthesia Other Findings   Reproductive/Obstetrics (+) Pregnancy                             Anesthesia Physical Anesthesia Plan  ASA: 2  Anesthesia Plan: Epidural   Post-op Pain Management:    Induction:   PONV Risk Score and Plan:   Airway Management Planned:   Additional Equipment:   Intra-op Plan:   Post-operative Plan:   Informed Consent: I have reviewed the patients History and Physical, chart, labs and discussed the procedure including the risks, benefits and alternatives for the proposed anesthesia with the patient or authorized representative who has indicated his/her understanding and acceptance.       Plan Discussed with:   Anesthesia Plan Comments:         Anesthesia Quick Evaluation

## 2022-07-23 ENCOUNTER — Encounter: Payer: Medicaid Other | Admitting: Advanced Practice Midwife

## 2022-07-23 ENCOUNTER — Other Ambulatory Visit: Payer: Self-pay

## 2022-07-23 ENCOUNTER — Inpatient Hospital Stay (HOSPITAL_COMMUNITY): Payer: Medicaid Other | Admitting: Anesthesiology

## 2022-07-23 ENCOUNTER — Other Ambulatory Visit: Payer: Medicaid Other

## 2022-07-23 ENCOUNTER — Encounter (HOSPITAL_COMMUNITY)
Admission: AD | Disposition: A | Discharge status: Home / Self Care | Payer: Self-pay | Source: Home / Self Care | Attending: Obstetrics & Gynecology

## 2022-07-23 DIAGNOSIS — Z302 Encounter for sterilization: Secondary | ICD-10-CM

## 2022-07-23 HISTORY — PX: TUBAL LIGATION: SHX77

## 2022-07-23 LAB — RPR: RPR Ser Ql: NONREACTIVE

## 2022-07-23 SURGERY — LIGATION, FALLOPIAN TUBE, POSTPARTUM
Anesthesia: Epidural | Laterality: Bilateral

## 2022-07-23 MED ORDER — MEASLES, MUMPS & RUBELLA VAC IJ SOLR: 0.5000 mL | Freq: Once | INTRAMUSCULAR | Status: DC

## 2022-07-23 MED ORDER — SIMETHICONE 80 MG PO CHEW: 80.0000 mg | CHEWABLE_TABLET | ORAL | Status: DC | PRN

## 2022-07-23 MED ORDER — PROPOFOL 10 MG/ML IV BOLUS
INTRAVENOUS | Status: AC
  Filled 2022-07-23: qty 20

## 2022-07-23 MED ORDER — ONDANSETRON HCL 4 MG/2ML IJ SOLN
INTRAMUSCULAR | Status: DC | PRN
  Administered 2022-07-23: 4 mg via INTRAVENOUS

## 2022-07-23 MED ORDER — FENTANYL CITRATE (PF) 100 MCG/2ML IJ SOLN
INTRAMUSCULAR | Status: AC
  Filled 2022-07-23: qty 2

## 2022-07-23 MED ORDER — DEXAMETHASONE SODIUM PHOSPHATE 10 MG/ML IJ SOLN
INTRAMUSCULAR | Status: AC
  Filled 2022-07-23: qty 1

## 2022-07-23 MED ORDER — ONDANSETRON HCL 4 MG/2ML IJ SOLN
INTRAMUSCULAR | Status: AC
  Filled 2022-07-23: qty 2

## 2022-07-23 MED ORDER — SODIUM CHLORIDE 0.9 % IR SOLN
Status: DC | PRN
  Administered 2022-07-23: 1

## 2022-07-23 MED ORDER — TETANUS-DIPHTH-ACELL PERTUSSIS 5-2.5-18.5 LF-MCG/0.5 IM SUSY: 0.5000 mL | PREFILLED_SYRINGE | Freq: Once | INTRAMUSCULAR | Status: DC

## 2022-07-23 MED ORDER — METOCLOPRAMIDE HCL 10 MG PO TABS
10.0000 mg | ORAL_TABLET | Freq: Once | ORAL | Status: AC
  Administered 2022-07-23: 10 mg via ORAL
  Filled 2022-07-23: qty 1

## 2022-07-23 MED ORDER — MIDAZOLAM HCL 2 MG/2ML IJ SOLN
INTRAMUSCULAR | Status: AC
  Filled 2022-07-23: qty 2

## 2022-07-23 MED ORDER — DIBUCAINE (PERIANAL) 1 % EX OINT: 1.0000 | TOPICAL_OINTMENT | CUTANEOUS | Status: DC | PRN

## 2022-07-23 MED ORDER — PRENATAL MULTIVITAMIN CH
1.0000 | ORAL_TABLET | Freq: Every day | ORAL | Status: DC
  Administered 2022-07-24: 1 via ORAL
  Filled 2022-07-23: qty 1

## 2022-07-23 MED ORDER — BUPIVACAINE HCL (PF) 0.25 % IJ SOLN
INTRAMUSCULAR | Status: DC | PRN
  Administered 2022-07-23: 10 mL

## 2022-07-23 MED ORDER — IBUPROFEN 600 MG PO TABS
600.0000 mg | ORAL_TABLET | Freq: Four times a day (QID) | ORAL | Status: DC
  Administered 2022-07-23 – 2022-07-24 (×6): 600 mg via ORAL
  Filled 2022-07-23 (×6): qty 1

## 2022-07-23 MED ORDER — ZOLPIDEM TARTRATE 5 MG PO TABS: 5.0000 mg | ORAL_TABLET | Freq: Every evening | ORAL | Status: DC | PRN

## 2022-07-23 MED ORDER — COCONUT OIL OIL: 1.0000 | TOPICAL_OIL | Status: DC | PRN

## 2022-07-23 MED ORDER — FENTANYL CITRATE (PF) 100 MCG/2ML IJ SOLN
INTRAMUSCULAR | Status: DC | PRN
  Administered 2022-07-23: 100 ug via EPIDURAL

## 2022-07-23 MED ORDER — LIDOCAINE-EPINEPHRINE (PF) 2 %-1:200000 IJ SOLN
INTRAMUSCULAR | Status: AC
  Filled 2022-07-23: qty 20

## 2022-07-23 MED ORDER — BENZOCAINE-MENTHOL 20-0.5 % EX AERO: 1.0000 | INHALATION_SPRAY | CUTANEOUS | Status: DC | PRN

## 2022-07-23 MED ORDER — ONDANSETRON HCL 4 MG PO TABS: 4.0000 mg | ORAL_TABLET | ORAL | Status: DC | PRN

## 2022-07-23 MED ORDER — OXYCODONE HCL 5 MG PO TABS: 5.0000 mg | ORAL_TABLET | ORAL | Status: DC | PRN

## 2022-07-23 MED ORDER — MIDAZOLAM HCL 2 MG/2ML IJ SOLN
INTRAMUSCULAR | Status: DC | PRN
  Administered 2022-07-23: 2 mg via INTRAVENOUS

## 2022-07-23 MED ORDER — FAMOTIDINE 20 MG PO TABS
40.0000 mg | ORAL_TABLET | Freq: Once | ORAL | Status: AC
  Administered 2022-07-23: 40 mg via ORAL
  Filled 2022-07-23: qty 2

## 2022-07-23 MED ORDER — SODIUM BICARBONATE 8.4 % IV SOLN
INTRAVENOUS | Status: DC | PRN
  Administered 2022-07-23: 5 mL via EPIDURAL
  Administered 2022-07-23: 2 mL via EPIDURAL
  Administered 2022-07-23: 1 mL via EPIDURAL
  Administered 2022-07-23: 2 mL via EPIDURAL
  Administered 2022-07-23 (×4): 5 mL via EPIDURAL

## 2022-07-23 MED ORDER — WITCH HAZEL-GLYCERIN EX PADS: 1.0000 | MEDICATED_PAD | CUTANEOUS | Status: DC | PRN

## 2022-07-23 MED ORDER — ONDANSETRON HCL 4 MG/2ML IJ SOLN: 4.0000 mg | INTRAMUSCULAR | Status: DC | PRN

## 2022-07-23 MED ORDER — BUPIVACAINE HCL (PF) 0.25 % IJ SOLN
INTRAMUSCULAR | Status: AC
  Filled 2022-07-23: qty 10

## 2022-07-23 MED ORDER — LIDOCAINE-EPINEPHRINE (PF) 2 %-1:200000 IJ SOLN
INTRAMUSCULAR | Status: AC
  Filled 2022-07-23: qty 40

## 2022-07-23 MED ORDER — DEXAMETHASONE SODIUM PHOSPHATE 10 MG/ML IJ SOLN
INTRAMUSCULAR | Status: DC | PRN
  Administered 2022-07-23: 10 mg via INTRAVENOUS

## 2022-07-23 MED ORDER — FENTANYL CITRATE (PF) 100 MCG/2ML IJ SOLN
INTRAMUSCULAR | Status: DC | PRN
  Administered 2022-07-23 (×2): 100 ug via INTRAVENOUS

## 2022-07-23 MED ORDER — SUCCINYLCHOLINE CHLORIDE 200 MG/10ML IV SOSY
PREFILLED_SYRINGE | INTRAVENOUS | Status: AC
  Filled 2022-07-23: qty 10

## 2022-07-23 MED ORDER — SUCCINYLCHOLINE CHLORIDE 200 MG/10ML IV SOSY
PREFILLED_SYRINGE | INTRAVENOUS | Status: DC | PRN
  Administered 2022-07-23: 160 mg via INTRAVENOUS

## 2022-07-23 MED ORDER — PROPOFOL 10 MG/ML IV BOLUS
INTRAVENOUS | Status: DC | PRN
  Administered 2022-07-23: 30 mg via INTRAVENOUS
  Administered 2022-07-23: 200 mg via INTRAVENOUS
  Administered 2022-07-23: 30 mg via INTRAVENOUS

## 2022-07-23 MED ORDER — ACETAMINOPHEN 325 MG PO TABS
650.0000 mg | ORAL_TABLET | ORAL | Status: DC | PRN
  Administered 2022-07-23: 650 mg via ORAL
  Filled 2022-07-23: qty 2

## 2022-07-23 MED ORDER — LACTATED RINGERS IV SOLN: INTRAVENOUS | Status: DC

## 2022-07-23 MED ORDER — BUPIVACAINE IN DEXTROSE 0.75-8.25 % IT SOLN
INTRATHECAL | Status: DC | PRN
  Administered 2022-07-23: 1.4 mL via INTRATHECAL

## 2022-07-23 MED ORDER — SENNOSIDES-DOCUSATE SODIUM 8.6-50 MG PO TABS
2.0000 | ORAL_TABLET | ORAL | Status: DC
  Administered 2022-07-24: 2 via ORAL
  Filled 2022-07-23 (×2): qty 2

## 2022-07-23 MED ORDER — DIPHENHYDRAMINE HCL 25 MG PO CAPS: 25.0000 mg | ORAL_CAPSULE | Freq: Four times a day (QID) | ORAL | Status: DC | PRN

## 2022-07-23 SURGICAL SUPPLY — 24 items
BLADE SURG 15 STRL LF C SS BP (BLADE) IMPLANT
BLADE SURG 15 STRL SS (BLADE) ×1
CLOTH BEACON ORANGE TIMEOUT ST (SAFETY) ×2 IMPLANT
DRSG OPSITE POSTOP 3X4 (GAUZE/BANDAGES/DRESSINGS) ×2 IMPLANT
DURAPREP 26ML APPLICATOR (WOUND CARE) ×2 IMPLANT
GLOVE BIOGEL PI IND STRL 7.0 (GLOVE) ×2 IMPLANT
GLOVE BIOGEL PI INDICATOR 7.0 (GLOVE) ×2
GLOVE ECLIPSE 7.0 STRL STRAW (GLOVE) ×4 IMPLANT
GOWN STRL REUS W/TWL LRG LVL3 (GOWN DISPOSABLE) ×4 IMPLANT
NEEDLE HYPO 22GX1.5 SAFETY (NEEDLE) ×3 IMPLANT
NS IRRIG 1000ML POUR BTL (IV SOLUTION) ×2 IMPLANT
PACK ABDOMINAL MINOR (CUSTOM PROCEDURE TRAY) ×2 IMPLANT
PROTECTOR NERVE ULNAR (MISCELLANEOUS) ×2 IMPLANT
SPONGE LAP 4X18 RFD (DISPOSABLE) IMPLANT
SUT MON AB 3-0 SH 27 (SUTURE) ×2
SUT MON AB 3-0 SH27 (SUTURE) IMPLANT
SUT MON AB-0 CT1 36 (SUTURE) IMPLANT
SUT VIC AB 0 CT1 27 (SUTURE) ×1
SUT VIC AB 0 CT1 27XBRD ANBCTR (SUTURE) ×1 IMPLANT
SUT VICRYL 4-0 PS2 18IN ABS (SUTURE) ×2 IMPLANT
SYR CONTROL 10ML LL (SYRINGE) ×3 IMPLANT
TOWEL OR 17X24 6PK STRL BLUE (TOWEL DISPOSABLE) ×4 IMPLANT
TRAY FOLEY CATH SILVER 14FR (SET/KITS/TRAYS/PACK) ×2 IMPLANT
WATER STERILE IRR 1000ML POUR (IV SOLUTION) ×2 IMPLANT

## 2022-07-23 NOTE — Progress Notes (Signed)
POSTPARTUM PROGRESS NOTE  Post Partum Day 1  Subjective:  Tonya Parks is a 26 y.o. D3O6712 s/p SVD at [redacted]w[redacted]d.  No acute events overnight.  Pt has not attempted to ambulate. She has not voided, passed flatus, or had a bowel movement. She has had good PO intake.  She denies nausea or vomiting.  Pain is well controlled.  She has not had flatus. Lochia Minimal.   Objective: Blood pressure (!) 109/59, pulse 70, temperature 97.8 F (36.6 C), temperature source Oral, resp. rate 18, height 5\' 7"  (1.702 m), weight 83.9 kg, last menstrual period 10/16/2021, SpO2 100 %, unknown if currently breastfeeding.  Physical Exam:  General: alert, cooperative and no distress Chest: no respiratory distress Heart: regular rate, distal pulses intact Abdomen: soft, nontender,  Uterine Fundus: firm, appropriately tender DVT Evaluation: No calf swelling or tenderness Extremities: no edema Skin: warm, dry;   Recent Labs    07/22/22 1525  HGB 11.9*  HCT 35.8*    Assessment/Plan: Dorla Guizar is a 26 y.o. 26 s/p SVD at [redacted]w[redacted]d   PPD#1 - Doing well Contraception: BTL scheduled for 1500 Feeding: Bottle Dispo: Plan for discharge tomorrow.   LOS: 1 day   [redacted]w[redacted]d, Medical Student, CNM 07/23/2022, 7:19 AM

## 2022-07-23 NOTE — Interval H&P Note (Signed)
History and Physical Interval Note:  07/23/2022 11:23 AM  Tonya Parks  has presented today for surgery now postpartum from an SVD, with the diagnosis of undesired fertility.  The various methods of treatment have been discussed with the patient and family. After consideration of risks, benefits and other options for treatment, the patient has consented to  Procedure(s): POST PARTUM TUBAL LIGATION (Bilateral) via salpingectomy as a surgical intervention.  The patient's history has been reviewed, patient examined, no change in status, stable for surgery. Patient counseled, r.e. Risks benefits of BTL, including permanency of procedure, decreased risk of ovarian cancer.  Patient verbalized understanding and desires to proceed   I have reviewed the patient's chart and labs.  Questions were answered to the patient's satisfaction.     Reva Bores

## 2022-07-23 NOTE — Anesthesia Postprocedure Evaluation (Signed)
Anesthesia Post Note  Patient: Tonya Parks  Procedure(s) Performed: AN AD HOC LABOR EPIDURAL     Patient location during evaluation: Mother Baby Anesthesia Type: Epidural Level of consciousness: awake Pain management: satisfactory to patient Vital Signs Assessment: post-procedure vital signs reviewed and stable Respiratory status: spontaneous breathing Cardiovascular status: stable Anesthetic complications: no   No notable events documented.  Last Vitals:  Vitals:   07/23/22 0444 07/23/22 0745  BP: (!) 109/59 (!) 101/59  Pulse: 70 (!) 58  Resp: 18 16  Temp: 36.6 C 36.6 C  SpO2: 100% 100%    Last Pain:  Vitals:   07/23/22 0745  TempSrc: Oral  PainSc:    Pain Goal:                   [employer]

## 2022-07-23 NOTE — Anesthesia Postprocedure Evaluation (Signed)
Anesthesia Post Note  Patient: Tonya Parks  Procedure(s) Performed: POST PARTUM TUBAL LIGATION (Bilateral)     Patient location during evaluation: PACU Anesthesia Type: General Level of consciousness: awake and alert and oriented Pain management: pain level controlled Vital Signs Assessment: post-procedure vital signs reviewed and stable Respiratory status: spontaneous breathing, nonlabored ventilation and respiratory function stable Cardiovascular status: blood pressure returned to baseline and stable Postop Assessment: no apparent nausea or vomiting and spinal receding Anesthetic complications: no   No notable events documented.  Last Vitals:  Vitals:   07/23/22 1348 07/23/22 1400  BP: 126/78 (!) 109/56  Pulse: (!) 103 72  Resp: (!) 21 13  Temp: 37.1 C   SpO2: 100% 100%    Last Pain:  Vitals:   07/23/22 1400  TempSrc:   PainSc: 0-No pain   Pain Goal:    LLE Motor Response: Non-purposeful movement (07/23/22 1400) LLE Sensation: Tingling (07/23/22 1400) RLE Motor Response: Non-purposeful movement (07/23/22 1400) RLE Sensation: Tingling (07/23/22 1400) L Sensory Level: L3-Anterior knee, lower leg (07/23/22 1400) R Sensory Level: L3-Anterior knee, lower leg (07/23/22 1400) Epidural/Spinal Function Cutaneous sensation: Able to Wiggle Toes (07/23/22 1415), Patient able to flex knees: Yes (07/23/22 1415), Patient able to lift hips off bed: Yes (07/23/22 1415), Back pain beyond tenderness at insertion site: No (07/23/22 1415), Progressively worsening motor and/or sensory loss: No (07/23/22 1415), Bowel and/or bladder incontinence post epidural: No (07/23/22 1415)  [name]

## 2022-07-23 NOTE — Anesthesia Preprocedure Evaluation (Signed)
Anesthesia Evaluation  Patient identified by MRN, date of birth, ID band Patient awake    Reviewed: Allergy & Precautions, H&P , NPO status , Patient's Chart, lab work & pertinent test results  Airway Mallampati: I       Dental no notable dental hx.    Pulmonary Current Smoker and Patient abstained from smoking.,    Pulmonary exam normal breath sounds clear to auscultation       Cardiovascular negative cardio ROS Normal cardiovascular exam Rhythm:Regular Rate:Normal     Neuro/Psych  Headaches, negative psych ROS   GI/Hepatic negative GI ROS, Neg liver ROS,   Endo/Other  negative endocrine ROS  Renal/GU negative Renal ROS  negative genitourinary   Musculoskeletal   Abdominal Normal abdominal exam  (+)   Peds  Hematology  (+) Blood dyscrasia, anemia ,   Anesthesia Other Findings   Reproductive/Obstetrics Undesired fertility                             Anesthesia Physical  Anesthesia Plan  ASA: 2  Anesthesia Plan: Epidural   Post-op Pain Management:    Induction:   PONV Risk Score and Plan: 3 and Treatment may vary due to age or medical condition and Ondansetron  Airway Management Planned: Natural Airway  Additional Equipment: None  Intra-op Plan:   Post-operative Plan:   Informed Consent: I have reviewed the patients History and Physical, chart, labs and discussed the procedure including the risks, benefits and alternatives for the proposed anesthesia with the patient or authorized representative who has indicated his/her understanding and acceptance.       Plan Discussed with: CRNA and Anesthesiologist  Anesthesia Plan Comments:         Anesthesia Quick Evaluation

## 2022-07-23 NOTE — Transfer of Care (Signed)
Immediate Anesthesia Transfer of Care Note  Patient: Tonya Parks  Procedure(s) Performed: POST PARTUM TUBAL LIGATION (Bilateral)  Patient Location: PACU  Anesthesia Type:General  Level of Consciousness: sedated  Airway & Oxygen Therapy: Patient Spontanous Breathing and Patient connected to nasal cannula oxygen  Post-op Assessment: Report given to RN  Post vital signs: Reviewed and stable  Last Vitals:  Vitals Value Taken Time  BP 126/78 07/23/22 1346  Temp    Pulse 94 07/23/22 1350  Resp 15 07/23/22 1350  SpO2 100 % 07/23/22 1350  Vitals shown include unvalidated device data.  Last Pain:  Vitals:   07/23/22 0745  TempSrc: Oral  PainSc: 0-No pain         Complications: No notable events documented.

## 2022-07-23 NOTE — Anesthesia Postprocedure Evaluation (Signed)
Anesthesia Post Note  Patient: Tonya Parks  Procedure(s) Performed: Redose epidural     Patient location during evaluation: Mother Baby Anesthesia Type: Epidural Level of consciousness: awake Pain management: satisfactory to patient Vital Signs Assessment: post-procedure vital signs reviewed and stable Respiratory status: spontaneous breathing Cardiovascular status: stable Anesthetic complications: no   No notable events documented.  Last Vitals:  Vitals:   07/23/22 0444 07/23/22 0745  BP: (!) 109/59 (!) 101/59  Pulse: 70 (!) 58  Resp: 18 16  Temp: 36.6 C 36.6 C  SpO2: 100% 100%    Last Pain:  Vitals:   07/23/22 0745  TempSrc: Oral  PainSc:    Pain Goal:                   [employer]

## 2022-07-23 NOTE — Anesthesia Procedure Notes (Signed)
Spinal  Patient location during procedure: OR Start time: 07/23/2022 12:42 PM End time: 07/23/2022 12:48 PM Reason for block: surgical anesthesia Staffing Anesthesiologist: Mal Amabile, MD Performed by: Mal Amabile, MD Authorized by: Mal Amabile, MD   Preanesthetic Checklist Completed: patient identified, IV checked, site marked, risks and benefits discussed, surgical consent, monitors and equipment checked, pre-op evaluation and timeout performed Spinal Block Patient position: sitting Prep: DuraPrep and site prepped and draped Patient monitoring: heart rate, cardiac monitor, continuous pulse ox and blood pressure Approach: midline Location: L3-4 Injection technique: single-shot Needle Needle type: Pencan  Needle gauge: 24 G Needle length: 9 cm Needle insertion depth: 7 cm Assessment Sensory level: T4 Events: CSF return Additional Notes Patient tolerated procedure well. Adequate sensory level.

## 2022-07-23 NOTE — Anesthesia Procedure Notes (Signed)
Procedure Name: Intubation Date/Time: 07/23/2022 1:01 PM  Performed by: Algis Greenhouse, CRNAPre-anesthesia Checklist: Patient identified, Emergency Drugs available, Suction available and Patient being monitored Patient Re-evaluated:Patient Re-evaluated prior to induction Oxygen Delivery Method: Circle system utilized Preoxygenation: Pre-oxygenation with 100% oxygen Induction Type: IV induction, Cricoid Pressure applied and Rapid sequence Laryngoscope Size: 3 and Glidescope Grade View: Grade II Tube size: 7.0 mm Number of attempts: 1 Airway Equipment and Method: Rigid stylet and Video-laryngoscopy Placement Confirmation: ETT inserted through vocal cords under direct vision, positive ETCO2 and breath sounds checked- equal and bilateral Secured at: 21 cm Tube secured with: Tape Dental Injury: Teeth and Oropharynx as per pre-operative assessment

## 2022-07-24 ENCOUNTER — Encounter (HOSPITAL_COMMUNITY): Payer: Self-pay | Admitting: Family Medicine

## 2022-07-24 DIAGNOSIS — Z302 Encounter for sterilization: Secondary | ICD-10-CM | POA: Diagnosis not present

## 2022-07-24 MED ORDER — ACETAMINOPHEN 10 MG/ML IV SOLN
INTRAVENOUS | Status: AC
  Filled 2022-07-24: qty 100

## 2022-07-24 MED ORDER — SODIUM BICARBONATE 8.4 % IV SOLN
INTRAVENOUS | Status: AC
  Filled 2022-07-24: qty 50

## 2022-07-24 MED ORDER — OXYCODONE HCL 5 MG PO TABS: 5.0000 mg | ORAL_TABLET | ORAL | 0 refills | Status: DC | PRN

## 2022-07-24 NOTE — Op Note (Signed)
Tonya Parks  07/23/2022  PREOPERATIVE DIAGNOSIS:  Multiparity, undesired fertility  POSTOPERATIVE DIAGNOSIS:  Multiparity, undesired fertility  PROCEDURE:  Postpartum Bilateral Tubal **Salpingectomy** Sterilization using Filshie Clips   ANESTHESIA:  Epidural and local analgesia using 0.25% Marcaine  COMPLICATIONS:  None immediate.  ESTIMATED BLOOD LOSS: 5 ml.  INDICATIONS: 26 y.o. [id number]  with undesired fertility,status post vaginal delivery, desires permanent sterilization.  Other reversible forms of contraception were discussed with patient; she declines all other modalities. Risks of procedure discussed with patient including but not limited to: risk of regret, permanence of method, bleeding, infection, injury to surrounding organs and need for additional procedures.  Failure risk of 0.5-1% with increased risk of ectopic gestation if pregnancy occurs was also discussed with patient.     FINDINGS:  Normal uterus, tubes, and ovaries.  PROCEDURE DETAILS: The patient was taken to the operating room where her epidural anesthesia was dosed up to surgical level and found to not be adequate. Spinal was also given but patient comfort was still not adequate. Patient required general anesthesia so procedure to proceed.  She was then placed in a supine position and prepped and draped in the usual sterile fashion.  After an adequate timeout was performed, attention was turned to the patient's abdomen where a small transverse skin incision was made under the umbilical fold. The incision was taken down to the layer of fascia using the scalpel, and fascia was incised, and extended bilaterally. The peritoneum was entered in a sharp fashion. The patient was placed in Trendelenburg.  A moist lap pad was used to move omentum and bowel away until the left fallopian tube was identified and grasped with a Babcock clamp, and followed out to the fimbriated end. Two Kelly clamps used under the tube with care to remove  the fimbriated end and most of the tube on the left. Tube removed with Metzenbaum scissors. A 2-0 Monocryl in a Heaney type stitch used times 2 under each Tresa Endo for hemostasis.  A similar process was carried out on the right side allowing for bilateral tubal sterilization.  Good hemostasis was noted overall.The instruments were then removed from the patient's abdomen and the fascial incision was repaired with 0 Vicryl, and the skin was closed with a 4-0 Vicryl subcuticular stitch. The patient tolerated the procedure well.  Sponge, lap, and needle counts were correct times two.  The patient was then taken to the recovery room awake, extubated and in stable condition.  Celedonio Savage MD 07/24/2022 6:06 AM

## 2022-07-24 NOTE — Discharge Instructions (Signed)
Remove dressing Saturday 07/28/2022.

## 2022-07-25 ENCOUNTER — Telehealth: Payer: Self-pay | Admitting: *Deleted

## 2022-07-25 LAB — SURGICAL PATHOLOGY

## 2022-07-25 NOTE — Telephone Encounter (Signed)
Patient called stating her milk has come in and she is not wanting to breastfeed.  Breasts are full and painful.  Denies fever, body aches or chills. Advised patient to put cold cabbage leaves in a tight fitting bra and no breast stimulation.  Can pump just enough to get some relief but no longer as this will only stimulate more milk production. Advised if she developed mastitis symptoms, to let us know.  Pt verbalized understanding with no further questions.

## 2022-07-28 ENCOUNTER — Encounter: Payer: Self-pay | Admitting: Women's Health

## 2022-07-31 ENCOUNTER — Telehealth (HOSPITAL_COMMUNITY): Payer: Self-pay

## 2022-07-31 NOTE — Telephone Encounter (Signed)
No answer. Mailbox is full and cannot except any messages.  Marcelino Duster [facility]  07/31/22,1432

## 2022-08-31 ENCOUNTER — Encounter: Payer: Self-pay | Admitting: Advanced Practice Midwife

## 2022-08-31 ENCOUNTER — Ambulatory Visit (INDEPENDENT_AMBULATORY_CARE_PROVIDER_SITE_OTHER): Payer: Medicaid Other | Admitting: Advanced Practice Midwife

## 2022-08-31 ENCOUNTER — Ambulatory Visit: Payer: Medicaid Other | Admitting: Obstetrics & Gynecology

## 2022-08-31 NOTE — Progress Notes (Signed)
POSTPARTUM VISIT Patient name: Tonya Parks MRN [id number]  Date of birth: 04-10-1996 Chief Complaint:   Postpartum Care  History of Present Illness:   Tonya Parks is a 26 y.o. G26P2012 Caucasian female being seen today for a postpartum visit. She is  5.5  weeks postpartum following a spontaneous vaginal delivery at 39.6 gestational weeks. IOL: yes, for PROM. Anesthesia: epidural.  Laceration: none.  Complications: none. Inpatient contraception: yes , ppBTS (salpingectomy) on PPD#2, no complications .   Pregnancy uncomplicated. Tobacco use: yes. Substance use disorder: no. Last pap smear: May 2023 and results were NILM w/ HRHPV not done. Next pap smear due: May 2026 No LMP recorded.  Postpartum course has been uncomplicated. Bleeding none. Bowel function is normal. Bladder function is normal. Urinary incontinence? no, fecal incontinence? no Patient is sexually active. Last sexual activity:  in August . Desired contraception: BTL done PP. Patient does not want a pregnancy in the future.  Desired family size is 2 children.   Upstream - 08/31/22 0843       Pregnancy Intention Screening   Does the patient want to become pregnant in the next year? No    Does the patient's partner want to become pregnant in the next year? No    Would the patient like to discuss contraceptive options today? No      Contraception Wrap Up   Current Method Female Sterilization    End Method Female Sterilization    Contraception Counseling Provided No            The pregnancy intention screening data noted above was reviewed. Potential methods of contraception were discussed. The patient elected to proceed with Female Sterilization.  Edinburgh Postpartum Depression Screening: negative  Edinburgh Postnatal Depression Scale - 08/31/22 0844       Edinburgh Postnatal Depression Scale:  In the Past 7 Days   I have been able to laugh and see the funny side of things. 0    I have looked forward with enjoyment  to things. 0    I have blamed myself unnecessarily when things went wrong. 0    I have been anxious or worried for no good reason. 0    I have felt scared or panicky for no good reason. 0    Things have been getting on top of me. 0    I have been so unhappy that I have had difficulty sleeping. 0    I have felt sad or miserable. 0    I have been so unhappy that I have been crying. 0    The thought of harming myself has occurred to me. 0    Edinburgh Postnatal Depression Scale Total 0                01/18/2022    1:43 PM 08/15/2021    9:47 AM  GAD 7 : Generalized Anxiety Score  Nervous, Anxious, on Edge 0 0  Control/stop worrying 0 0  Worry too much - different things 0 0  Trouble relaxing 0 0  Restless 0 0  Easily annoyed or irritable 0 0  Afraid - awful might happen 0 0  Total GAD 7 Score 0 0     Baby's course has been uncomplicated. Baby is feeding by bottle. Infant has a pediatrician/family doctor? Yes.  Childcare strategy if returning to work/school:  going to work in Nov- still working on a childcare plan .  Pt has material needs met for her and baby: Yes.  Review of Systems:   Pertinent items are noted in HPI Denies Abnormal vaginal discharge w/ itching/odor/irritation, headaches, visual changes, shortness of breath, chest pain, abdominal pain, severe nausea/vomiting, or problems with urination or bowel movements. Pertinent History Reviewed:  Reviewed past medical,surgical, obstetrical and family history.  Reviewed problem list, medications and allergies. OB History  Gravida Para Term Preterm AB Living  3 2 2   1 2   SAB IAB Ectopic Multiple Live Births  1     0 2    # Outcome Date GA Lbr Len/2nd Weight Sex Delivery Anes PTL Lv  3 Term 07/22/22 [id number] / 00:19 8 lb (3.63 kg) M Vag-Spont EPI  LIV  2 Term 03/30/17 [id number] lb 3 oz (4.167 kg) M Vag-Spont EPI N LIV  1 SAB 04/28/16           Physical Assessment:   Vitals:   08/31/22 0842  BP: 112/80  Pulse: 84   Weight: 175 lb (79.4 kg)  Body mass index is 27.41 kg/m.       Physical Examination:   General appearance: alert, well appearing, and in no distress  Mental status: alert, oriented to person, place, and time  Skin: warm & dry   Cardiovascular: normal heart rate noted   Respiratory: normal respiratory effort, no distress   Breasts: deferred, no complaints   Abdomen: soft, non-tender; BTS incision healing  Pelvic: examination not indicated. Thin prep pap obtained: No  Rectal: not examined  Extremities: Edema: none         No results found for this or any previous visit (from the past 24 hour(s)).  Assessment & Plan:  1) Postpartum exam 2) 5.5 wks s/p spontaneous vaginal delivery 3) bottle feeding 4) Depression screening: negative 5) Contraception management: s/p ppBTS (bilat salpingectomy)  Essential components of care per ACOG recommendations:  1.  Mood and well being:  If positive depression screen, discussed and plan developed.  If using tobacco we discussed reduction/cessation and risk of relapse If current substance abuse, we discussed and referral to local resources was offered.   2. Infant care and feeding:  If breastfeeding, discussed returning to work, pumping, breastfeeding-associated pain, guidance regarding return to fertility while lactating if not using another method. If needed, patient was provided with a letter to be allowed to pump q 2-3hrs to support lactation in a private location with access to a refrigerator to store breastmilk.   Recommended that all caregivers be immunized for flu, pertussis and other preventable communicable diseases If pt does not have material needs met for her/baby, referred to local resources for help obtaining these.  3. Sexuality, contraception and birth spacing Provided guidance regarding sexuality, management of dyspareunia, and resumption of intercourse Discussed avoiding interpregnancy interval <22m51m and recommended birth  spacing of 18 months  4. Sleep and fatigue Discussed coping options for fatigue and sleep disruption Encouraged family/partner/community support of 4 hrs of uninterrupted sleep to help with mood and fatigue  5. Physical recovery  If pt had a C/S, assessed incisional pain and providing guidance on normal vs prolonged recovery If pt had a laceration, perineal healing and pain reviewed.  If urinary or fecal incontinence, discussed management and referred to PT or uro/gyn if indicated  Patient is safe to resume physical activity. Discussed attainment of healthy weight.  6.  Chronic disease management Discussed pregnancy complications if any, and their implications for future childbearing and long-term maternal health. Review recommendations for prevention of recurrent pregnancy complications, such  as 17 hydroxyprogesterone caproate to reduce risk for recurrent PTB not applicable, or aspirin to reduce risk of preeclampsia not applicable. Pt had GDM: no. If yes, 2hr GTT scheduled: not applicable. Reviewed medications and non-pregnant dosing including consideration of whether pt is breastfeeding using a reliable resource such as LactMed: not applicable Referred for f/u w/ PCP or subspecialist providers as indicated: not applicable  7. Health maintenance Mammogram at 25yo or earlier if indicated Pap smears as indicated  Meds: No orders of the defined types were placed in this encounter.   Follow-up: Return in about 1 year (around 09/01/2023) for Physical.   No orders of the defined types were placed in this encounter.   Myrtis Ser CNM 08/31/2022 9:00 AM

## 2022-08-31 NOTE — Patient Instructions (Signed)
Use this website for any postpartum needs: [email or web address].net- congratulations!    Tonya Parks

## 2022-09-25 ENCOUNTER — Other Ambulatory Visit (INDEPENDENT_AMBULATORY_CARE_PROVIDER_SITE_OTHER): Payer: Medicaid Other

## 2022-09-25 DIAGNOSIS — R300 Dysuria: Secondary | ICD-10-CM

## 2022-09-25 DIAGNOSIS — R350 Frequency of micturition: Secondary | ICD-10-CM | POA: Diagnosis not present

## 2022-09-25 LAB — POCT URINALYSIS DIPSTICK OB
Glucose, UA: NEGATIVE
Ketones, UA: NEGATIVE
Nitrite, UA: NEGATIVE
POC,PROTEIN,UA: NEGATIVE

## 2022-09-25 NOTE — Progress Notes (Signed)
   NURSE VISIT- UTI SYMPTOMS   SUBJECTIVE:  Tonya Parks is a 26 y.o. [phone] female here for UTI symptoms. She is postpartum. She reports dysuria and urinary frequency.  OBJECTIVE:  LMP 09/19/2022   Breastfeeding No   Appears well, in no apparent distress  Results for orders placed or performed in visit on 09/25/22 (from the past 24 hour(s))  POC Urinalysis Dipstick OB   Collection Time: 09/25/22  4:19 PM  Result Value Ref Range   Color, UA     Clarity, UA     Glucose, UA Negative Negative   Bilirubin, UA     Ketones, UA neg    Spec Grav, UA     Blood, UA trace    pH, UA     POC,PROTEIN,UA Negative Negative, Trace, Small (1+), Moderate (2+), Large (3+), 4+   Urobilinogen, UA     Nitrite, UA neg    Leukocytes, UA Trace (A) Negative   Appearance     Odor      ASSESSMENT: Postpartum with UTI symptoms and negative nitrites  PLAN: Note routed to Derrek Monaco, AGNP   Rx sent by provider today: No Urine culture sent Call or return to clinic prn if these symptoms worsen or fail to improve as anticipated. Follow-up: as needed   Alice Rieger  09/25/2022 4:19 PM

## 2022-09-26 LAB — URINALYSIS, ROUTINE W REFLEX MICROSCOPIC
Bilirubin, UA: NEGATIVE
Glucose, UA: NEGATIVE
Ketones, UA: NEGATIVE
Leukocytes,UA: NEGATIVE
Nitrite, UA: NEGATIVE
Protein,UA: NEGATIVE
RBC, UA: NEGATIVE
Specific Gravity, UA: 1.02 (ref 1.005–1.030)
Urobilinogen, Ur: 0.2 mg/dL (ref 0.2–1.0)
pH, UA: 7 (ref 5.0–7.5)

## 2022-09-27 LAB — URINE CULTURE

## 2022-10-24 ENCOUNTER — Encounter: Payer: Self-pay | Admitting: Obstetrics & Gynecology

## 2022-10-24 ENCOUNTER — Ambulatory Visit (INDEPENDENT_AMBULATORY_CARE_PROVIDER_SITE_OTHER): Payer: Medicaid Other | Admitting: Obstetrics & Gynecology

## 2022-10-24 VITALS — BP 106/75 | HR 74 | Ht 67.0 in | Wt 183.0 lb

## 2022-10-24 DIAGNOSIS — N750 Cyst of Bartholin's gland: Secondary | ICD-10-CM

## 2022-10-24 NOTE — Progress Notes (Signed)
   GYN VISIT Patient name: Tonya Parks MRN [id number]  Date of birth: July 28, 1996 Chief Complaint:   Groin Swelling (Vagina swollen)  History of Present Illness:   Tonya Parks is a 26 y.o. [phone] female being seen today for the following concerns:  -Last night, she felt down there and noted it felt swollen.  Not really painful, just enlarged.  No drainage.  Denies vaginal discharge, itching or odor.  No pelvic pain.  No urinary problems.  No other acute complaints.  Patient's last menstrual period was 10/17/2022 (exact date).     01/18/2022    1:43 PM 08/15/2021    9:45 AM  Depression screen PHQ 2/9  Decreased Interest 0 0  Down, Depressed, Hopeless 0 0  PHQ - 2 Score 0 0  Altered sleeping 0 0  Tired, decreased energy 1 0  Change in appetite 1 0  Feeling bad or failure about yourself  0 0  Trouble concentrating 0 0  Moving slowly or fidgety/restless 0 0  Suicidal thoughts 0 0  PHQ-9 Score 2 0     Review of Systems:   Pertinent items are noted in HPI Denies fever/chills, dizziness, headaches, visual disturbances, fatigue, shortness of breath, chest pain, abdominal pain, vomiting, no problems with periods, bowel movements, urination, or intercourse unless otherwise stated above.  Pertinent History Reviewed:  Reviewed past medical,surgical, social, obstetrical and family history.  Reviewed problem list, medications and allergies. Physical Assessment:   Vitals:   10/24/22 1046  BP: 106/75  Pulse: 74  Weight: 183 lb (83 kg)  Height: 5\' 7"  (1.702 m)  Body mass index is 28.66 kg/m.       Physical Examination:   General appearance: alert, well appearing, and in no distress  Psych: mood appropriate, normal affect  Skin: warm & dry   Cardiovascular: normal heart rate noted  Respiratory: normal respiratory effort, no distress  Pelvic: VULVA: normal external genitalia, enlarged left bartholin's gland- ~ 3cm in size- non-tender, no induration or erythema  Extremities: no  edema  Chaperone:  [name]     Bartholin Cyst I&D and Word Catheter Placement Enlarged cyst in front of the hymenal ring around 5 or 7 o' clock.  Written informed consent was obtained.  Discussed complications and possible outcomes of procedure including recurrence of cyst, scarring leading to infection, bleeding, dyspareunia, distortion of anatomy.  Patient was examined in the dorsal lithotomy position and mass was identified.  The area was prepped with Iodine and draped in a sterile manner. 1% Lidocaine (3 ml) was then used to infiltrate area on top of the cyst, behind the hymenal ring.  A 5 mm incision was made using a sterile scapel. Immediate drainage of clear fluid was appreciated.  Silver nitrazine was used for hemostasis at the base of the cyst.  Patient tolerated the procedure well  Assessment & Plan:  1) Bartholin's cyst -reviewed findings discussed conservative vs procedural drainage -risk/benefits reviewed- pt desires I&D today -see above procedure note -recommend Sitz bath and should she need OTC pain management -f/u prn   No orders of the defined types were placed in this encounter.   No follow-ups on file.   , DO Attending Obstetrician & Gynecologist, [facility] for [facility]

## 2022-10-25 ENCOUNTER — Encounter: Payer: Self-pay | Admitting: Obstetrics & Gynecology

## 2023-01-24 ENCOUNTER — Ambulatory Visit: Payer: Medicaid Other

## 2023-08-22 ENCOUNTER — Ambulatory Visit: Payer: Medicaid Other | Admitting: Obstetrics and Gynecology

## 2023-10-08 ENCOUNTER — Encounter: Payer: Self-pay | Admitting: Women's Health

## 2023-10-08 ENCOUNTER — Other Ambulatory Visit (HOSPITAL_COMMUNITY)
Admission: RE | Admit: 2023-10-08 | Discharge: 2023-10-08 | Disposition: A | Discharge status: Home / Self Care | Payer: Medicaid Other | Source: Ambulatory Visit | Attending: Women's Health | Admitting: Women's Health

## 2023-10-08 ENCOUNTER — Ambulatory Visit (INDEPENDENT_AMBULATORY_CARE_PROVIDER_SITE_OTHER): Payer: Medicaid Other | Admitting: Women's Health

## 2023-10-08 VITALS — BP 113/79 | HR 76 | Ht 67.0 in | Wt 198.6 lb

## 2023-10-08 DIAGNOSIS — R102 Pelvic and perineal pain: Secondary | ICD-10-CM | POA: Diagnosis not present

## 2023-10-08 DIAGNOSIS — N898 Other specified noninflammatory disorders of vagina: Secondary | ICD-10-CM

## 2023-10-08 DIAGNOSIS — Z01419 Encounter for gynecological examination (general) (routine) without abnormal findings: Secondary | ICD-10-CM | POA: Diagnosis not present

## 2023-10-08 NOTE — Progress Notes (Signed)
WELL-WOMAN EXAMINATION Patient name: Tonya Parks MRN [id number]  Date of birth: July 09, 1996 Chief Complaint:   Gynecologic Exam  History of Present Illness:   Tonya Parks is a 27 y.o. G21P2012 Caucasian female being seen today for a routine well-woman exam.  Current complaints: random sharp low abd/pelvic pain x 1-20mths. Some white vaginal d/c, no itching/odor/irritation. BM every couple of days. No UTI sx.   PCP: none      does not desire labs Patient's last menstrual period was 09/30/2023. The current method of family planning is tubal ligation.  Last pap 05/03/22. Results were: NILM w/ HRHPV not done. H/O abnormal pap: yes Last mammogram: never. Results were: N/A. Family h/o breast cancer: maybe MA-not sure age at dx Last colonoscopy: never. Results were: N/A. Family h/o colorectal cancer: no     10/08/2023    8:49 AM 01/18/2022    1:43 PM 08/15/2021    9:45 AM  Depression screen PHQ 2/9  Decreased Interest 0 0 0  Down, Depressed, Hopeless 0 0 0  PHQ - 2 Score 0 0 0  Altered sleeping 0 0 0  Tired, decreased energy 0 1 0  Change in appetite 0 1 0  Feeling bad or failure about yourself  0 0 0  Trouble concentrating 0 0 0  Moving slowly or fidgety/restless 0 0 0  Suicidal thoughts 0 0 0  PHQ-9 Score 0 2 0        10/08/2023    8:49 AM 01/18/2022    1:43 PM 08/15/2021    9:47 AM  GAD 7 : Generalized Anxiety Score  Nervous, Anxious, on Edge 0 0 0  Control/stop worrying 0 0 0  Worry too much - different things 0 0 0  Trouble relaxing 0 0 0  Restless 0 0 0  Easily annoyed or irritable 0 0 0  Afraid - awful might happen 0 0 0  Total GAD 7 Score 0 0 0     Review of Systems:   Pertinent items are noted in HPI Denies any headaches, blurred vision, fatigue, shortness of breath, chest pain, abdominal pain, abnormal vaginal discharge/itching/odor/irritation, problems with periods, bowel movements, urination, or intercourse unless otherwise stated above. Pertinent History  Reviewed:  Reviewed past medical,surgical, social and family history.  Reviewed problem list, medications and allergies. Physical Assessment:   Vitals:   10/08/23 0844  BP: 113/79  Pulse: 76  Weight: 198 lb 9.6 oz (90.1 kg)  Height: 5\' 7"  (1.702 m)  Body mass index is 31.11 kg/m.        Physical Examination:   General appearance - well appearing, and in no distress  Mental status - alert, oriented to person, place, and time  Psych:  She has a normal mood and affect  Skin - warm and dry, normal color, no suspicious lesions noted  Chest - effort normal, all lung fields clear to auscultation bilaterally  Heart - normal rate and regular rhythm  Neck:  midline trachea, no thyromegaly or nodules  Breasts - breasts appear normal, no suspicious masses, no skin or nipple changes or  axillary nodes  Abdomen - soft, nontender, nondistended, no masses or organomegaly  Pelvic - VULVA: normal appearing vulva with no masses, tenderness or lesions  VAGINA: normal appearing vagina with normal color and discharge, no lesions  CERVIX: normal appearing cervix without discharge or lesions, no CMT  Thin prep pap is not done   UTERUS: uterus is felt to be normal size, shape, consistency and nontender  ADNEXA: No adnexal masses or tenderness noted.  Extremities:  No swelling or varicosities noted  Chaperone: Faith Rogue    No results found for this or any previous visit (from the past 24 hour(s)).  Assessment & Plan:  1) Well-Woman Exam  2) Random sharp low abd/pelvic pain and vaginal d/c> CV swab, some constipation-gave printed relief measures. If worsening/not improving let us know and will get u/s  Labs/procedures today: CV swab  Mammogram: @ 26yo, or sooner if problems Colonoscopy: @ 26yo, or sooner if problems  No orders of the defined types were placed in this encounter.   Meds: No orders of the defined types were placed in this encounter.   Follow-up: Return in about 1 year  (around 10/07/2024) for Physical.  Cheral Marker CNM, [facility] 10/08/2023 9:24 AM

## 2023-10-08 NOTE — Patient Instructions (Signed)

## 2023-10-09 LAB — CERVICOVAGINAL ANCILLARY ONLY
Bacterial Vaginitis (gardnerella): POSITIVE — AB
Candida Glabrata: NEGATIVE
Candida Vaginitis: NEGATIVE
Chlamydia: NEGATIVE
Comment: NEGATIVE
Comment: NEGATIVE
Comment: NEGATIVE
Comment: NEGATIVE
Comment: NEGATIVE
Comment: NORMAL
Neisseria Gonorrhea: NEGATIVE
Trichomonas: NEGATIVE

## 2023-10-09 MED ORDER — METRONIDAZOLE 500 MG PO TABS: 500.0000 mg | ORAL_TABLET | Freq: Two times a day (BID) | ORAL | 0 refills | Status: DC

## 2023-10-09 NOTE — Addendum Note (Signed)
Addended by: Cheral Marker on: 10/09/2023 03:23 PM   Modules accepted: Orders

## 2023-10-11 ENCOUNTER — Telehealth: Payer: Self-pay | Admitting: *Deleted

## 2023-10-11 NOTE — Telephone Encounter (Signed)
Attempted to reach patient regarding swab, no answer and VM full.

## 2023-10-14 ENCOUNTER — Telehealth: Payer: Self-pay | Admitting: *Deleted

## 2023-10-14 ENCOUNTER — Encounter: Payer: Self-pay | Admitting: Women's Health

## 2023-10-14 NOTE — Telephone Encounter (Signed)
Attempted to reach patient regarding swab results.  VM not set up.

## 2023-12-26 IMAGING — DX DG CHEST 1V PORT
1 series · 1 of 1 positions shown · non-contrast
Comparison: None.

CLINICAL DATA: Chest pain

EXAM:
PORTABLE CHEST 1 VIEW

[chest]
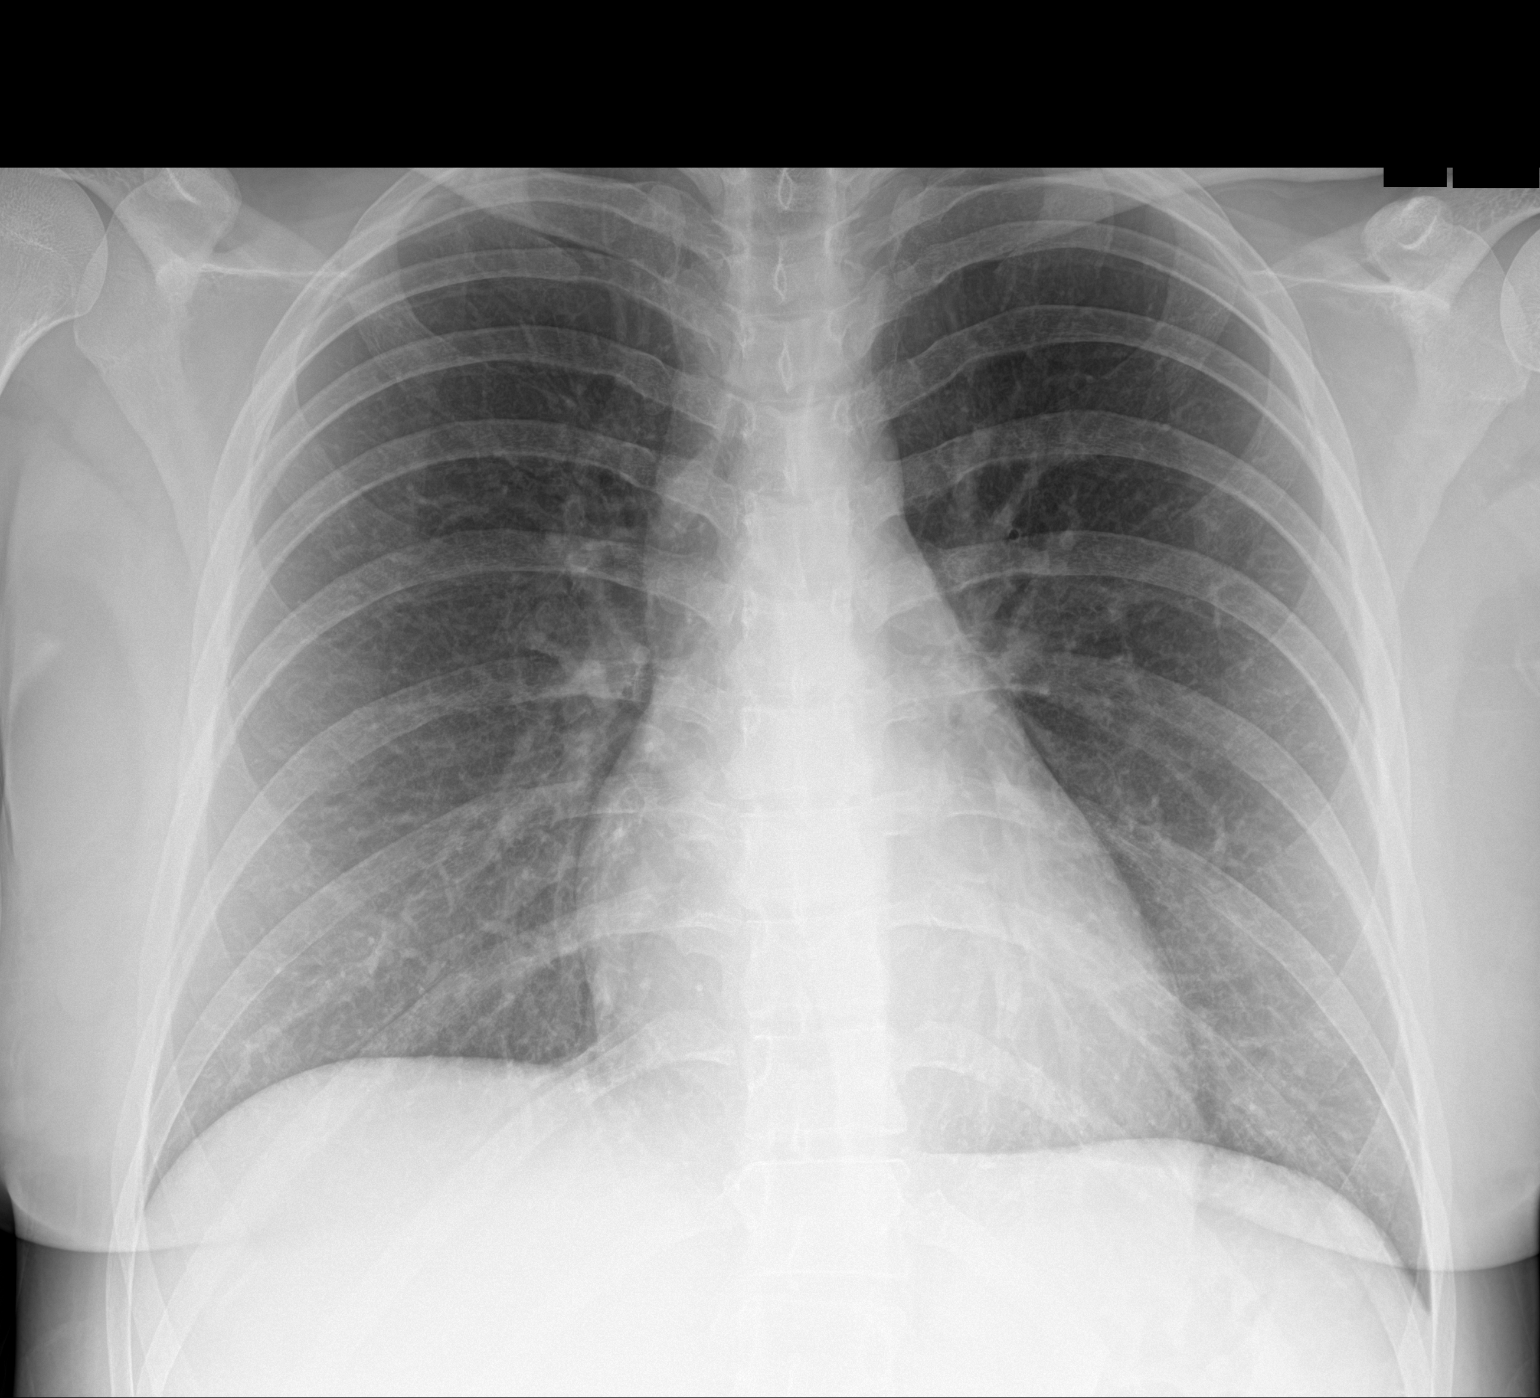

[1 of 1 positions shown; findings below may reference images not displayed]

FINDINGS: The heart size and mediastinal contours are within normal limits.
Both lungs are clear. The visualized skeletal structures are
unremarkable.
IMPRESSION: No active disease.

## 2024-02-17 ENCOUNTER — Ambulatory Visit: Admitting: Adult Health

## 2024-02-17 ENCOUNTER — Encounter: Payer: Self-pay | Admitting: Adult Health

## 2024-02-17 VITALS — BP 116/75 | HR 82 | Ht 67.0 in | Wt 194.0 lb

## 2024-02-17 DIAGNOSIS — L0231 Cutaneous abscess of buttock: Secondary | ICD-10-CM | POA: Insufficient documentation

## 2024-02-17 MED ORDER — SULFAMETHOXAZOLE-TRIMETHOPRIM 800-160 MG PO TABS: 1.0000 | ORAL_TABLET | Freq: Two times a day (BID) | ORAL | 0 refills | Status: AC

## 2024-02-17 MED ORDER — SILVER SULFADIAZINE 1 % EX CREA: 1.0000 | TOPICAL_CREAM | Freq: Two times a day (BID) | CUTANEOUS | 0 refills | Status: AC

## 2024-02-17 NOTE — Progress Notes (Signed)
  Subjective:     Patient ID: Tonya Parks, female   DOB: 17-Apr-1996, 28 y.o.   MRN: [id number]  HPI Tonya Parks is a 28 year old white female, single, [id number] in complaining of bump in crease left leg, noticed this morning. It is tender.    Component Value Date/Time   DIAGPAP  05/03/2022 0938    - Negative for intraepithelial lesion or malignancy (NILM)   ADEQPAP Satisfactory for evaluation.  The absence of an 05/03/2022 0938   ADEQPAP  05/03/2022 0938    endocervical/transformation zone component is not uncommon in pregnant   ADEQPAP patients. 05/03/2022 3086   Review of Systems Noticed bump crease left leg this morning,tender Reviewed past medical,surgical, social and family history. Reviewed medications and allergies.     Objective:   Physical Exam BP 116/75 (BP Location: Left Arm, Patient Position: Sitting, Cuff Size: Normal)   Pulse 82   Ht 5\' 7"  (1.702 m)   Wt 194 lb (88 kg)   LMP 02/06/2024 (Approximate)   Breastfeeding No   BMI 30.38 kg/m     Skin warm and dry.Pelvic: external genitalia is normal in appearance no lesions,  has 1 cm red raised boil, left buttock, mildly tender, no induration.   Fall risk is low  Upstream - 02/17/24 1501       Pregnancy Intention Screening   Does the patient want to become pregnant in the next year? No    Does the patient's partner want to become pregnant in the next year? No    Would the patient like to discuss contraceptive options today? No      Contraception Wrap Up   Current Method Female Sterilization    End Method Female Sterilization    Contraception Counseling Provided No            Examination chaperoned by Malachy Mood LPN  Assessment:     1. Abscess of left buttock (Primary) Has bump left buttock, noticed today, it is abscess Will rx septra ds 1 bid x 14 days and silvadene, apply bid   Meds ordered this encounter  Medications   sulfamethoxazole-trimethoprim (BACTRIM DS) 800-160 MG tablet    Sig: Take 1 tablet by  mouth 2 (two) times daily. Take 1 bid    Dispense:  28 tablet    Refill:  0    Supervising Provider:   Duane Lope H [[id number]]   silver sulfADIAZINE (SILVADENE) 1 % cream    Sig: Apply 1 Application topically 2 (two) times daily.    Dispense:  50 g    Refill:  0    Supervising Provider:   Duane Lope H [[id number]]    Use warm compress or sit in tub, can use epsom salt Do not squeeze   Review handout on skin abscess  Plan:     Follow up in 1 week for recheck

## 2024-02-17 NOTE — Patient Instructions (Signed)
 Use warm compresses Will recheck in 1 week Take antibiotics

## 2024-02-24 ENCOUNTER — Ambulatory Visit: Admitting: Adult Health

## 2024-02-24 ENCOUNTER — Encounter: Payer: Self-pay | Admitting: Adult Health

## 2024-02-24 VITALS — BP 113/64 | HR 69 | Ht 67.0 in | Wt 194.0 lb

## 2024-02-24 DIAGNOSIS — L0231 Cutaneous abscess of buttock: Secondary | ICD-10-CM

## 2024-02-24 NOTE — Progress Notes (Signed)
  Subjective:     Patient ID: Tonya Parks, female   DOB: [birth year], 28 y.o.   MRN: [id number]  HPI [name] is a 28 year old white female,single, [id number] back in follow up on abscess on left  buttock and it is better. She was late starting septra ds.     Component Value Date/Time   DIAGPAP  05/03/2022 0938    - Negative for intraepithelial lesion or malignancy (NILM)   ADEQPAP Satisfactory for evaluation.  The absence of an 05/03/2022 0938   ADEQPAP  05/03/2022 0938    endocervical/transformation zone component is not uncommon in pregnant   ADEQPAP patients. 05/03/2022 5784     Review of Systems Abscess is better Reviewed past medical,surgical, social and family history. Reviewed medications and allergies.     Objective:   Physical Exam BP 113/64 (BP Location: Left Arm, Patient Position: Sitting, Cuff Size: Normal)   Pulse 69   Ht 5\' 7"  (1.702 m)   Wt 194 lb (88 kg)   LMP 02/06/2024 (Approximate)   Breastfeeding No   BMI 30.38 kg/m     Skin warm and dry.Pelvic: external genitalia is normal in appearance, abscess resolving left buttock, non tender   Upstream - 02/24/24 1015       Pregnancy Intention Screening   Does the patient want to become pregnant in the next year? No    Does the patient's partner want to become pregnant in the next year? No    Would the patient like to discuss contraceptive options today? No      Contraception Wrap Up   Current Method Female Sterilization    End Method Female Sterilization    Contraception Counseling Provided No            Examination chaperoned by Malachy Mood LPN  Assessment:     1. Abscess of left buttock (Primary) Resolving, finish septra ds    Plan:     Follow up prn
